# Patient Record
Sex: Female | Born: 1985 | Race: Black or African American | Hispanic: No | Marital: Single | Smoking: Never smoker
Health system: Southern US, Community
[De-identification: ages and names within clinical notes are randomized; demographics above are authoritative.]

## PROBLEM LIST (undated history)

## (undated) ENCOUNTER — Inpatient Hospital Stay (HOSPITAL_COMMUNITY): Payer: Self-pay

## (undated) DIAGNOSIS — F32A Depression, unspecified: Secondary | ICD-10-CM

## (undated) DIAGNOSIS — J45909 Unspecified asthma, uncomplicated: Secondary | ICD-10-CM

## (undated) DIAGNOSIS — F329 Major depressive disorder, single episode, unspecified: Secondary | ICD-10-CM

## (undated) HISTORY — DX: Unspecified asthma, uncomplicated: J45.909

## (undated) HISTORY — DX: Depression, unspecified: F32.A

---
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
## 2008-10-08 ENCOUNTER — Emergency Department (HOSPITAL_COMMUNITY): Admission: EM | Admit: 2008-10-08 | Discharge: 2008-10-08 | Payer: Self-pay | Admitting: Emergency Medicine

## 2010-12-05 LAB — URINALYSIS, ROUTINE W REFLEX MICROSCOPIC
Glucose, UA: NEGATIVE mg/dL
Ketones, ur: 15 mg/dL — AB
Nitrite: NEGATIVE
Protein, ur: NEGATIVE mg/dL

## 2010-12-05 LAB — COMPREHENSIVE METABOLIC PANEL
ALT: 15 U/L (ref 0–35)
Alkaline Phosphatase: 66 U/L (ref 39–117)
CO2: 22 mEq/L (ref 19–32)
Calcium: 9.1 mg/dL (ref 8.4–10.5)
Chloride: 108 mEq/L (ref 96–112)
GFR calc non Af Amer: >60 mL/min (ref >60)
Glucose, Bld: 97 mg/dL (ref 70–99)
Sodium: 137 mEq/L (ref 135–145)
Total Bilirubin: 0.4 mg/dL (ref 0.3–1.2)

## 2010-12-05 LAB — CBC
Hemoglobin: 12.4 g/dL (ref 12.0–15.0)
MCHC: 34.3 g/dL (ref 30.0–36.0)
RBC: 4.21 MIL/uL (ref 3.87–5.11)

## 2010-12-05 LAB — POCT I-STAT, CHEM 8
HCT: 36 % (ref 36.0–46.0)
Hemoglobin: 12.2 g/dL (ref 12.0–15.0)
Potassium: 3.4 mEq/L — ABNORMAL LOW (ref 3.5–5.1)
Sodium: 140 mEq/L (ref 135–145)
TCO2: 22 mmol/L (ref 0–100)

## 2010-12-05 LAB — URINE MICROSCOPIC-ADD ON

## 2010-12-05 LAB — DIFFERENTIAL
Basophils Absolute: 0 10*3/uL (ref 0.0–0.1)
Basophils Relative: 0 % (ref 0–1)
Eosinophils Absolute: 0.3 10*3/uL (ref 0.0–0.7)
Neutrophils Relative %: 79 % — ABNORMAL HIGH (ref 43–77)

## 2013-04-08 ENCOUNTER — Ambulatory Visit: Payer: BC Managed Care – PPO | Attending: Internal Medicine | Admitting: Internal Medicine

## 2013-04-08 VITALS — BP 111/75 | HR 76 | Temp 98.2°F | Resp 18 | Wt 157.8 lb

## 2013-04-08 DIAGNOSIS — Z0000 Encounter for general adult medical examination without abnormal findings: Secondary | ICD-10-CM

## 2013-04-08 MED ORDER — TUBERCULIN PPD 5 UNIT/0.1ML ID SOLN
5.0000 [IU] | Freq: Once | INTRADERMAL | Status: AC
  Administered 2013-04-10: 5 [IU] via INTRADERMAL

## 2013-04-08 NOTE — Progress Notes (Signed)
Patient Demographics  Colleen Boyd, is a 27 y.o. female  WUJ:[id number]  NFA:[id number]  DOB - Jul 07, 1986  Chief Complaint  Patient presents with  . Establish Care        Subjective:   Colleen Boyd today is here to establish primary care. Patient has a  Past Medical History  Diagnosis Date  . Asthma   . Currently patient has no complaints. 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 Patient has also has No headache, No chest pain, No abdominal pain,No Nausea, No new weakness tingling or numbness, No Cough or SOB.   Objective:    Filed Vitals:   04/08/13 1707  BP: 111/75  Pulse: 76  Temp: 98.2 F (36.8 C)  Resp: 18  Weight: 157 lb 12.8 oz (71.578 kg)  SpO2: 99%     ALLERGIES:  No Known Allergies  PAST MEDICAL HISTORY: Past Medical History  Diagnosis Date  . Asthma     PAST SURGICAL HISTORY: History reviewed. No pertinent past surgical history.  FAMILY HISTORY: History reviewed. No pertinent family history.  MEDICATIONS AT HOME: Prior to Admission medications   Not on File    SOCIAL HISTORY:   reports that she has never smoked. She does not have any smokeless tobacco history on file. Her alcohol and drug histories are not on file.  REVIEW OF SYSTEMS:  Constitutional:   No   Fevers, chills, fatigue.  HEENT:    No headaches, Sore throat,   Cardio-vascular: No chest pain,  Orthopnea, swelling in lower extremities, anasarca, palpitations  GI:  No abdominal pain, nausea, vomiting, diarrhea  Resp: No shortness of breath,  No coughing up of blood.No cough.No wheezing.  Skin:  no rash or lesions.  GU:  no dysuria, change in color of urine, no urgency or frequency.  No flank pain.  Musculoskeletal: No joint pain or swelling.  No decreased range of motion.  No back  pain.  Psych: No change in mood or affect. No depression or anxiety.  No memory loss.   Exam  General appearance :Awake, alert, not in any distress. Speech Clear. Not toxic Looking HEENT: Atraumatic and Normocephalic, pupils equally reactive to light and accomodation Neck: supple, no JVD. No cervical lymphadenopathy.  Chest:Good air entry bilaterally, no added sounds  CVS: S1 S2 regular, no murmurs.  Abdomen: Bowel sounds present, Non tender and not distended with no gaurding, rigidity or rebound. Extremities: B/L Lower Ext shows no edema, both legs are warm to touch Neurology: Awake alert, and oriented X 3, CN II-XII intact, Non focal Skin:No Rash Wounds:N/A    Data Review   CBC No results found for this basename: WBC, HGB, HCT, PLT, MCV, MCH, MCHC, RDW, NEUTRABS, LYMPHSABS, MONOABS, EOSABS, BASOSABS, BANDABS, BANDSABD,  in the last 168 hours  Chemistries   No results found for this basename: NA, K, CL, CO2, GLUCOSE, BUN, CREATININE, GFRCGP, CALCIUM, MG, AST, ALT, ALKPHOS, BILITOT,  in the last 168 hours ------------------------------------------------------------------------------------------------------------------ No results found for this basename: HGBA1C,  in the last 72 hours ------------------------------------------------------------------------------------------------------------------ No results found for this basename: CHOL, HDL, LDLCALC, TRIG, CHOLHDL, LDLDIRECT,  in the last 72 hours ------------------------------------------------------------------------------------------------------------------ No results found for this basename: TSH, T4TOTAL, FREET3, T3FREE, THYROIDAB,  in the last 72 hours ------------------------------------------------------------------------------------------------------------------ No results found for this basename: VITAMINB12, FOLATE, FERRITIN, TIBC, IRON, RETICCTPCT,  in  the last 72 hours  Coagulation profile  No results found for this  basename: INR, PROTIME,  in the last 168 hours    Assessment & Plan   Exam within normal limits-needs a PPD-if negative-we can go ahead and sign her paperwork. We'll place a PPD today, bring her back in 48 hours.

## 2013-04-08 NOTE — Progress Notes (Signed)
Patient here to establish care Works for [employer] Needs TB test

## 2013-04-10 ENCOUNTER — Ambulatory Visit: Payer: BC Managed Care – PPO | Attending: Family Medicine

## 2013-04-10 NOTE — Progress Notes (Unsigned)
Patient came in to have her PPD test read It was neg with 0 duration

## 2013-12-04 ENCOUNTER — Inpatient Hospital Stay (HOSPITAL_COMMUNITY)
Admission: AD | Admit: 2013-12-04 | Discharge: 2013-12-04 | Disposition: A | Discharge status: Home / Self Care | Payer: BC Managed Care – PPO | Source: Ambulatory Visit | Attending: Family Medicine | Admitting: Family Medicine

## 2013-12-04 ENCOUNTER — Emergency Department (HOSPITAL_COMMUNITY)
Admission: EM | Admit: 2013-12-04 | Discharge: 2013-12-04 | Discharge status: Absent without leave | Payer: BC Managed Care – PPO | Source: Home / Self Care

## 2013-12-04 ENCOUNTER — Encounter (HOSPITAL_COMMUNITY): Payer: Self-pay

## 2013-12-04 ENCOUNTER — Inpatient Hospital Stay (HOSPITAL_COMMUNITY): Payer: BC Managed Care – PPO

## 2013-12-04 DIAGNOSIS — O34599 Maternal care for other abnormalities of gravid uterus, unspecified trimester: Secondary | ICD-10-CM | POA: Insufficient documentation

## 2013-12-04 DIAGNOSIS — N831 Corpus luteum cyst of ovary, unspecified side: Secondary | ICD-10-CM | POA: Insufficient documentation

## 2013-12-04 DIAGNOSIS — O43899 Other placental disorders, unspecified trimester: Secondary | ICD-10-CM

## 2013-12-04 DIAGNOSIS — O208 Other hemorrhage in early pregnancy: Secondary | ICD-10-CM | POA: Insufficient documentation

## 2013-12-04 DIAGNOSIS — O468X1 Other antepartum hemorrhage, first trimester: Secondary | ICD-10-CM

## 2013-12-04 DIAGNOSIS — O418X10 Other specified disorders of amniotic fluid and membranes, first trimester, not applicable or unspecified: Secondary | ICD-10-CM

## 2013-12-04 LAB — CBC
HEMATOCRIT: 37 % (ref 36.0–46.0)
HEMOGLOBIN: 12.6 g/dL (ref 12.0–15.0)
MCH: 27.9 pg (ref 26.0–34.0)
MCHC: 34.1 g/dL (ref 30.0–36.0)
MCV: 81.9 fL (ref 78.0–100.0)
Platelets: 309 10*3/uL (ref 150–400)
RBC: 4.52 MIL/uL (ref 3.87–5.11)
RDW: 14.1 % (ref 11.5–15.5)
WBC: 13.4 10*3/uL — ABNORMAL HIGH (ref 4.0–10.5)

## 2013-12-04 LAB — POCT PREGNANCY, URINE: Preg Test, Ur: POSITIVE — AB

## 2013-12-04 LAB — URINALYSIS, ROUTINE W REFLEX MICROSCOPIC
BILIRUBIN URINE: NEGATIVE
GLUCOSE, UA: NEGATIVE mg/dL
Ketones, ur: 15 mg/dL — AB
NITRITE: NEGATIVE
PH: 5 (ref 5.0–8.0)
Protein, ur: NEGATIVE mg/dL
SPECIFIC GRAVITY, URINE: 1.025 (ref 1.005–1.030)
Urobilinogen, UA: 0.2 mg/dL (ref 0.0–1.0)

## 2013-12-04 LAB — URINE MICROSCOPIC-ADD ON

## 2013-12-04 LAB — WET PREP, GENITAL
CLUE CELLS WET PREP: NONE SEEN
Trich, Wet Prep: NONE SEEN
Yeast Wet Prep HPF POC: NONE SEEN

## 2013-12-04 LAB — ABO/RH: ABO/RH(D): O POS

## 2013-12-04 LAB — HCG, QUANTITATIVE, PREGNANCY: HCG, BETA CHAIN, QUANT, S: 9470 m[IU]/mL — AB (ref <5)

## 2013-12-04 NOTE — Discharge Instructions (Signed)
Pelvic Rest °Pelvic rest is sometimes recommended for women when:  °· The placenta is partially or completely covering the opening of the cervix (placenta previa). °· There is bleeding between the uterine wall and the amniotic sac in the first trimester (subchorionic hemorrhage). °· The cervix begins to open without labor starting (incompetent cervix, cervical insufficiency). °· The labor is too early (preterm labor). °HOME CARE INSTRUCTIONS °· Do not have sexual intercourse, stimulation, or an orgasm. °· Do not use tampons, douche, or put anything in the vagina. °· Do not lift anything over 10 pounds (4.5 kg). °· Avoid strenuous activity or straining your pelvic muscles. °SEEK MEDICAL CARE IF:  °· You have any vaginal bleeding during pregnancy. Treat this as a potential emergency. °· You have cramping pain felt low in the stomach (stronger than menstrual cramps). °· You notice vaginal discharge (watery, mucus, or bloody). °· You have a low, dull backache. °· There are regular contractions or uterine tightening. °SEEK IMMEDIATE MEDICAL CARE IF: °You have vaginal bleeding and have placenta previa.  °Document Released: 12/01/2010 Document Revised: 10/29/2011 Document Reviewed: 12/01/2010 °ExitCare® Patient Information ©2014 [employer]. °Subchorionic Hematoma °A subchorionic hematoma is a gathering of blood between the outer wall of the placenta and the inner wall of the womb (uterus). The placenta is the organ that connects the fetus to the wall of the uterus. The placenta performs the feeding, breathing (oxygen to the fetus), and waste removal (excretory work) of the fetus.  °Subchorionic hematoma is the most common abnormality found on a result from ultrasonography done during the first trimester or early second trimester of pregnancy. If there has been little or no vaginal bleeding, early small hematomas usually shrink on their own and do not affect your baby or pregnancy. The blood is gradually absorbed over 1  2 weeks. When bleeding starts later in pregnancy or the hematoma is larger or occurs in an older pregnant woman, the outcome may not be as good. Larger hematomas may get bigger, which increases the chances for miscarriage. Subchorionic hematoma also increases the risk of premature detachment of the placenta from the uterus, preterm (premature) labor, and stillbirth. °HOME CARE INSTRUCTIONS  °· Stay on bed rest if your health care provider recommends this. Although bed rest will not prevent more bleeding or prevent a miscarriage, your health care provider may recommend bed rest until you are advised otherwise. °· Avoid heavy lifting (more than 10 lb [4.5 kg]), exercise, sexual intercourse, or douching as directed by your health care provider. °· Keep track of the number of pads you use each day and how soaked (saturated) they are. Write down this information. °· Do not use tampons. °· Keep all follow-up appointments as directed by your health care provider. Your health care provider may ask you to have follow-up blood tests or ultrasound tests or both. °SEEK IMMEDIATE MEDICAL CARE IF:  °· You have severe cramps in your stomach, back, abdomen, or pelvis. °· You have a fever. °· You pass large clots or tissue. Save any tissue for your health care provider to look at. °· Your bleeding increases or you become lightheaded, feel weak, or have fainting episodes. °Document Released: 11/21/2006 Document Revised: 05/27/2013 Document Reviewed: 03/05/2013 °ExitCare® Patient Information ©2014 [employer]. ° °

## 2013-12-04 NOTE — MAU Provider Note (Signed)
History     CSN: [id number]  Arrival date and time: 12/04/13 1628   None     Chief Complaint  Patient presents with  . Possible Pregnancy  . Vaginal Bleeding   HPI Colleen Boyd is 28 y.o. G1P0 [redacted]w[redacted]d weeks presenting with vaginal bleeding in early pregnancy.  A little heavier now.  She had + pregnancy test at [facility] in [place], had bleeding and states she no longer feels pregnant.    Past Medical History  Diagnosis Date  . Asthma     History reviewed. No pertinent past surgical history.  Family History  Problem Relation Age of Onset  . Hypertension Maternal Grandmother     History  Substance Use Topics  . Smoking status: Never Smoker   . Smokeless tobacco: Not on file  . Alcohol Use: Yes     Comment: ocassionally    Allergies: No Known Allergies  Prescriptions prior to admission  Medication Sig Dispense Refill  . Prenatal Vit-Fe Fumarate-FA (PRENATAL MULTIVITAMIN) TABS tablet Take 1 tablet by mouth daily at 12 noon.        Review of Systems  Constitutional: Negative for fever and chills.  Gastrointestinal: Negative for nausea, vomiting and abdominal pain.  Genitourinary: Negative for dysuria, urgency and frequency.       + for vaginal spotting.  Neurological: Negative for headaches.   Physical Exam   Blood pressure 118/71, pulse 90, temperature 98.7 F (37.1 C), temperature source Oral, resp. rate 16, height 5\' 5"  (1.651 m), weight 75.479 kg (166 lb 6.4 oz), last menstrual period 10/09/2013, SpO2 100.00%.  Physical Exam  Constitutional: She is oriented to person, place, and time. She appears well-developed and well-nourished. No distress.  HENT:  Head: Normocephalic.  Cardiovascular: Normal rate.   Respiratory: Effort normal.  Genitourinary: Uterus is not enlarged and not tender. Cervix exhibits no motion tenderness, no discharge and no friability. Right adnexum displays no mass and no tenderness. Left adnexum displays no mass and no tenderness. There is  bleeding (scant dark brown blood noted in the vaginal vault) around the vagina. No vaginal discharge found.  Neurological: She is alert and oriented to person, place, and time.  Skin: Skin is warm and dry. No erythema.  Psychiatric: She has a normal mood and affect.   Results for orders placed during the hospital encounter of 12/04/13 (from the past 24 hour(s))  URINALYSIS, ROUTINE W REFLEX MICROSCOPIC     Status: Abnormal   Collection Time    12/04/13  4:55 PM      Result Value Ref Range   Color, Urine YELLOW  YELLOW   APPearance CLEAR  CLEAR   Specific Gravity, Urine 1.025  1.005 - 1.030   pH 5.0  5.0 - 8.0   Glucose, UA NEGATIVE  NEGATIVE mg/dL   Hgb urine dipstick LARGE (*) NEGATIVE   Bilirubin Urine NEGATIVE  NEGATIVE   Ketones, ur 15 (*) NEGATIVE mg/dL   Protein, ur NEGATIVE  NEGATIVE mg/dL   Urobilinogen, UA 0.2  0.0 - 1.0 mg/dL   Nitrite NEGATIVE  NEGATIVE   Leukocytes, UA TRACE (*) NEGATIVE  URINE MICROSCOPIC-ADD ON     Status: Abnormal   Collection Time    12/04/13  4:55 PM      Result Value Ref Range   Squamous Epithelial / LPF FEW (*) RARE   WBC, UA 3-6  <3 WBC/hpf   RBC / HPF 3-6  <3 RBC/hpf   Bacteria, UA MANY (*) RARE  HCG, QUANTITATIVE, PREGNANCY  Status: Abnormal   Collection Time    12/04/13  5:10 PM      Result Value Ref Range   hCG, Beta Chain, Quant, S 9470 (*) <5 mIU/mL  ABO/RH     Status: None   Collection Time    12/04/13  5:10 PM      Result Value Ref Range   ABO/RH(D) O POS    CBC     Status: Abnormal   Collection Time    12/04/13  5:11 PM      Result Value Ref Range   WBC 13.4 (*) 4.0 - 10.5 K/uL   RBC 4.52  3.87 - 5.11 MIL/uL   Hemoglobin 12.6  12.0 - 15.0 g/dL   HCT 16.137.0  09.636.0 - 04.546.0 %   MCV 81.9  78.0 - 100.0 fL   MCH 27.9  26.0 - 34.0 pg   MCHC 34.1  30.0 - 36.0 g/dL   RDW 40.914.1  81.111.5 - 91.415.5 %   Platelets 309  150 - 400 K/uL  POCT PREGNANCY, URINE     Status: Abnormal   Collection Time    12/04/13  5:12 PM      Result Value Ref  Range   Preg Test, Ur POSITIVE (*) NEGATIVE  WET PREP, GENITAL     Status: Abnormal   Collection Time    12/04/13  7:15 PM      Result Value Ref Range   Yeast Wet Prep HPF POC NONE SEEN  NONE SEEN   Trich, Wet Prep NONE SEEN  NONE SEEN   Clue Cells Wet Prep HPF POC NONE SEEN  NONE SEEN   WBC, Wet Prep HPF POC FEW (*) NONE SEEN   MAU Course  Procedures  MDM Care turned over to J. Vernie AmmonsEthier, PA-C at 18:00 Verita Schneidersvelyn M Key 12/04/2013, 5:52 PM  1800 - Care assumed from Jeani SowEve Key, NP  Assessment and Plan    [place] Ob Comp Less 14 Wks  12/04/2013   CLINICAL DATA:  Bleeding in early pregnancy.  EXAM: OBSTETRIC <14 WK US AND TRANSVAGINAL OB US  TECHNIQUE: Both transabdominal and transvaginal ultrasound examinations were performed for complete evaluation of the gestation as well as the maternal uterus, adnexal regions, and pelvic cul-de-sac. Transvaginal technique was performed to assess early pregnancy.  COMPARISON:  None.  FINDINGS: Intrauterine gestational sac: Visualized/normal in shape.  Yolk sac:  Visualized.  Embryo:  Visualized.  Cardiac Activity: Visualized.  Heart Rate:  139 bpm  CRL:   3.2  mm   6 w 6 d                  US EDC: 07/24/2014  Maternal uterus/adnexae: Small subchorionic hemorrhage. Corpus luteum cyst in the right ovary. Left ovary is visualized. No free fluid.  IMPRESSION: 1. Single living intrauterine pregnancy with gestational age of [redacted] weeks 6 days and estimated date of confinement of 07/24/2014. 2. Small subchorionic hemorrhage.   Electronically Signed   By: Leanna BattlesMelinda  Blietz M.D.   On: 12/04/2013 18:58   [place] Ob Transvaginal  12/04/2013   CLINICAL DATA:  Bleeding in early pregnancy.  EXAM: OBSTETRIC <14 WK US AND TRANSVAGINAL OB US  TECHNIQUE: Both transabdominal and transvaginal ultrasound examinations were performed for complete evaluation of the gestation as well as the maternal uterus, adnexal regions, and pelvic cul-de-sac. Transvaginal technique was performed to assess early  pregnancy.  COMPARISON:  None.  FINDINGS: Intrauterine gestational sac: Visualized/normal in shape.  Yolk sac:  Visualized.  Embryo:  Visualized.  Cardiac Activity: Visualized.  Heart Rate:  139 bpm  CRL:   3.2  mm   6 w 6 d                  US EDC: 07/24/2014  Maternal uterus/adnexae: Small subchorionic hemorrhage. Corpus luteum cyst in the right ovary. Left ovary is visualized. No free fluid.  IMPRESSION: 1. Single living intrauterine pregnancy with gestational age of [redacted] weeks 6 days and estimated date of confinement of 07/24/2014. 2. Small subchorionic hemorrhage.   Electronically Signed   By: Leanna BattlesMelinda  Blietz M.D.   On: 12/04/2013 18:58    A: SIUP at 1190w6d with normal cardiac activity Small subchorionic hemorrhage  P: Discharge home Bleeding precautions discussed Pelvic rest advised Patient plans to start care with [facility] Pregnancy confirmation letter given Patient may return to MAU as needed or if her condition were to change or worsen  Freddi StarrJulie N Ethier, PA-C 12/04/2013 7:35 PM

## 2013-12-04 NOTE — MAU Note (Signed)
Patient states she has had a positive pregnancy at a [facility] office in [place]. States she had a little dark pink spotting on tissue with wiping and does not feel pregnant any more. Denies pain. Denies nausea or vomiting.

## 2013-12-04 NOTE — MAU Provider Note (Signed)
Attestation of Attending Supervision of Advanced Practitioner (PA/CNM/NP): Evaluation and management procedures were performed by the Advanced Practitioner under my supervision and collaboration.  I have reviewed the Advanced Practitioner's note and chart, and I agree with the management and plan.  Daine Gunther S Cheron Pasquarelli, MD [facility] Attending 12/04/2013 9:56 PM   

## 2013-12-05 LAB — GC/CHLAMYDIA PROBE AMP
CT Probe RNA: NEGATIVE
GC Probe RNA: NEGATIVE

## 2014-06-21 ENCOUNTER — Encounter (HOSPITAL_COMMUNITY): Payer: Self-pay

## 2014-10-09 ENCOUNTER — Encounter (HOSPITAL_COMMUNITY): Payer: Self-pay | Admitting: *Deleted

## 2015-02-08 IMAGING — US US OB COMP LESS 14 WK
1 series · 14 of 28 positions shown · non-contrast
Comparison: None.

CLINICAL DATA: Bleeding in early pregnancy.

EXAM:
OBSTETRIC <14 WK US AND TRANSVAGINAL OB US
TECHNIQUE: Both transabdominal and transvaginal ultrasound examinations were
performed for complete evaluation of the gestation as well as the
maternal uterus, adnexal regions, and pelvic cul-de-sac.
Transvaginal technique was performed to assess early pregnancy.

[Series 1: us ob comp less 14 wks · 14 of 64 slices shown]
[im 3/64]
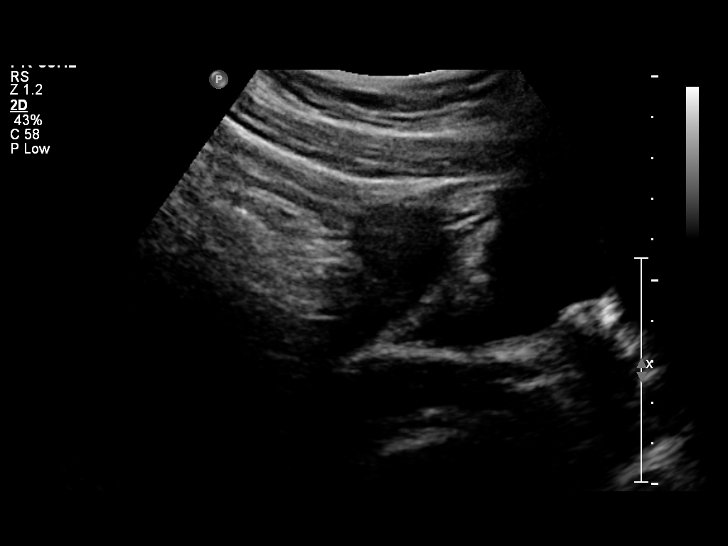
[im 8/64]
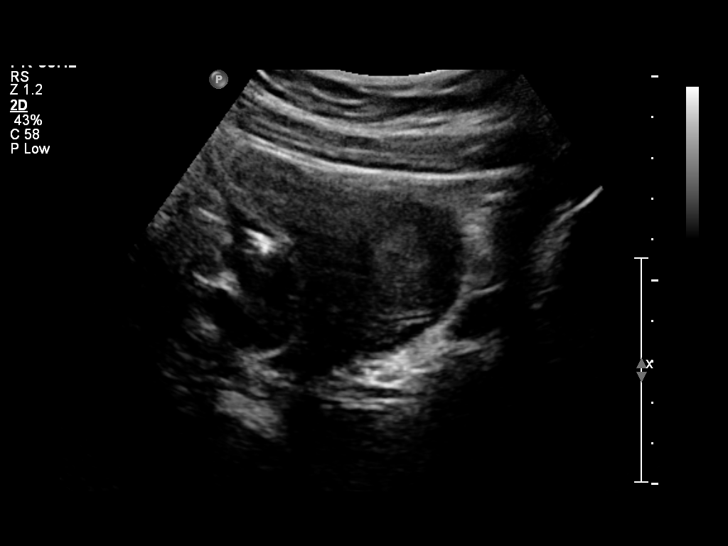
[im 12/64]
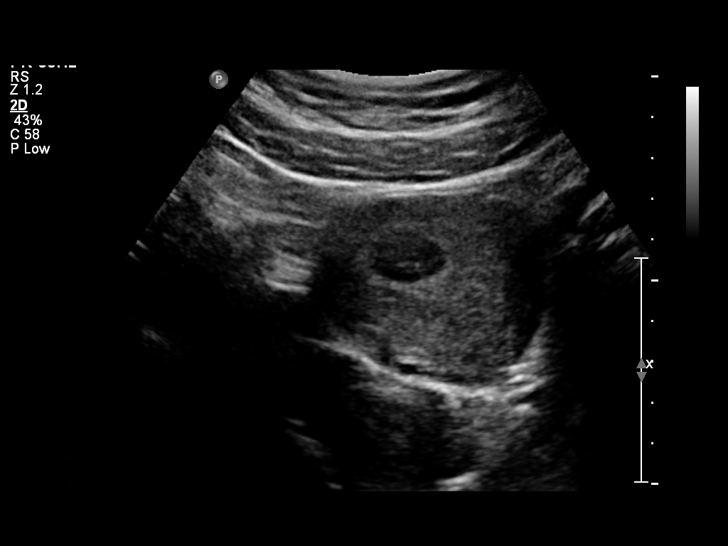
[im 17/64]
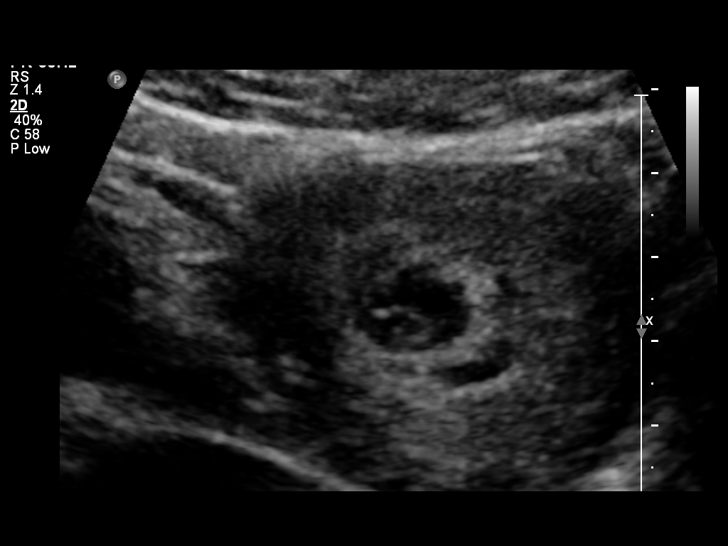
[im 22/64]
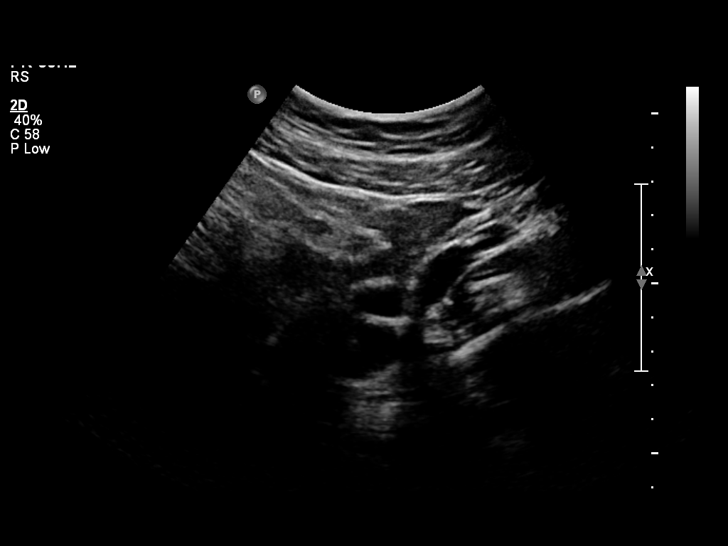
[im 26/64]
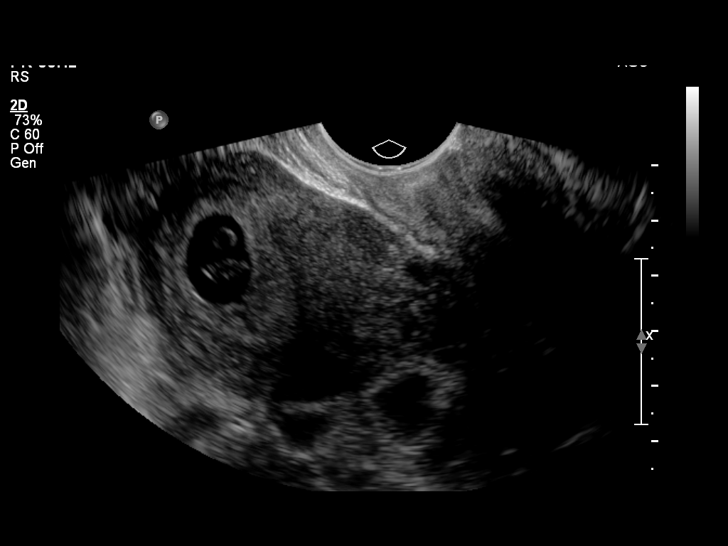
[im 31/64]
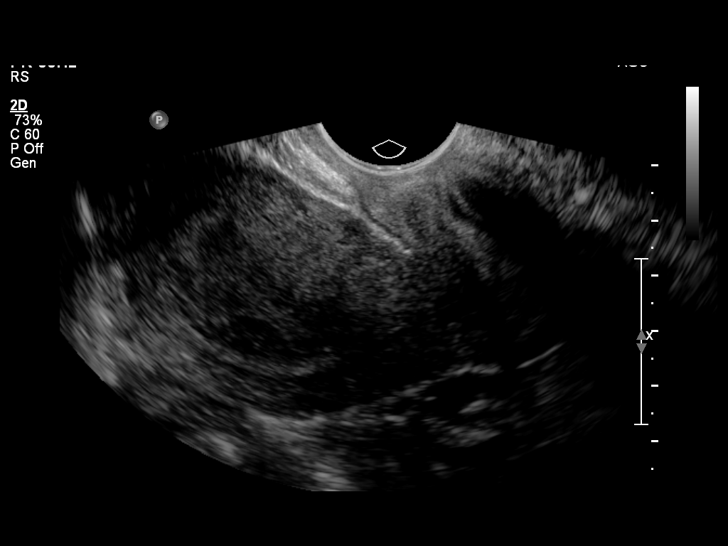
[im 36/64]
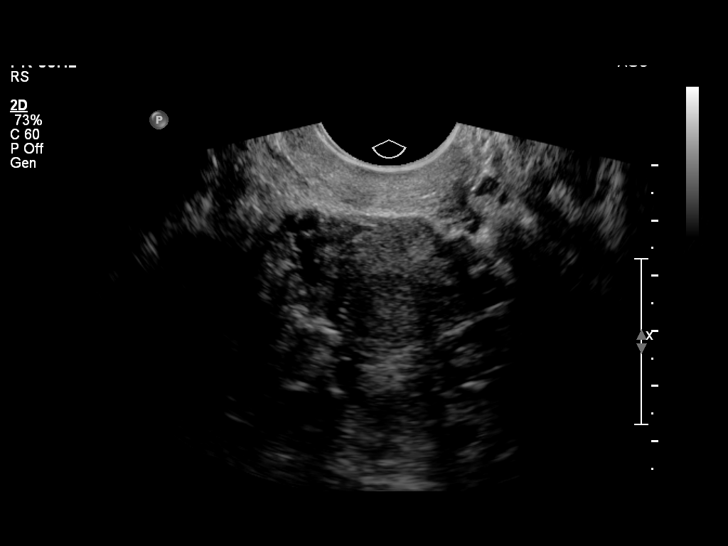
[im 40/64]
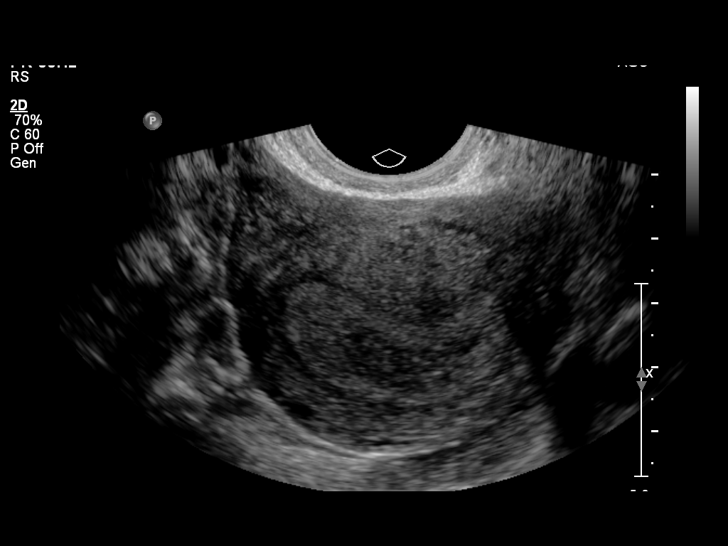
[im 45/64]
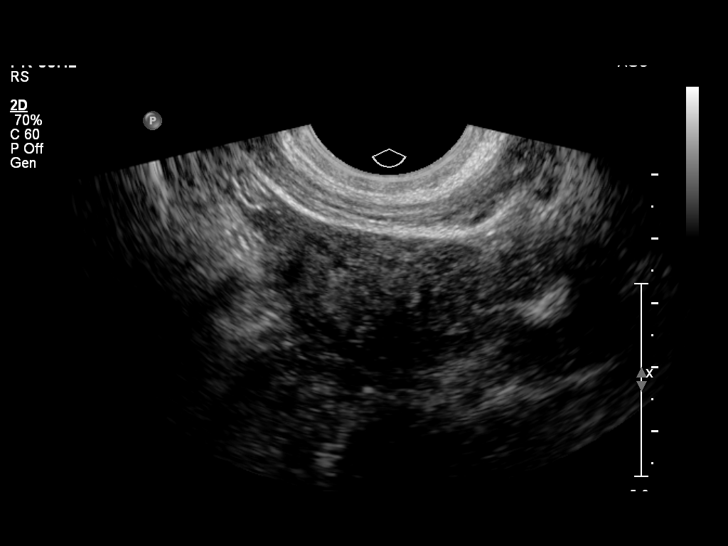
[im 50/64]
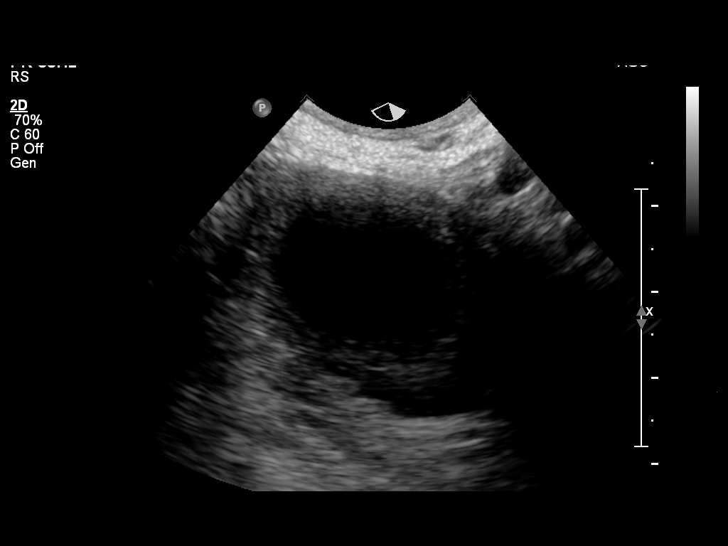
[im 54/64]
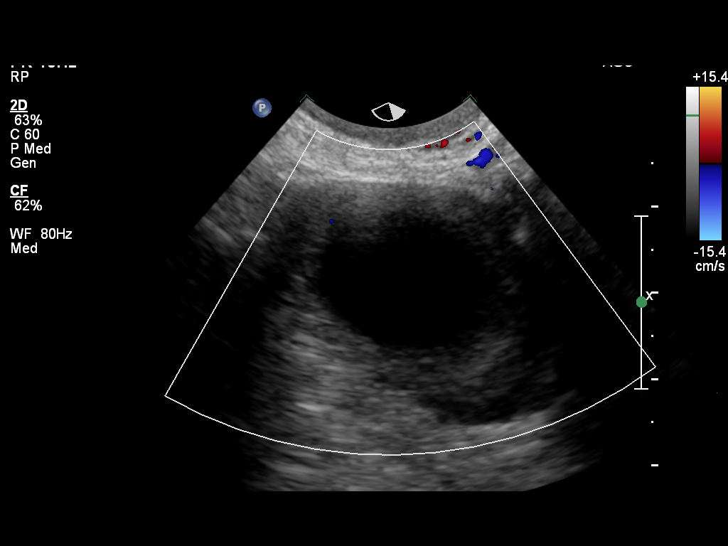
[im 59/64]
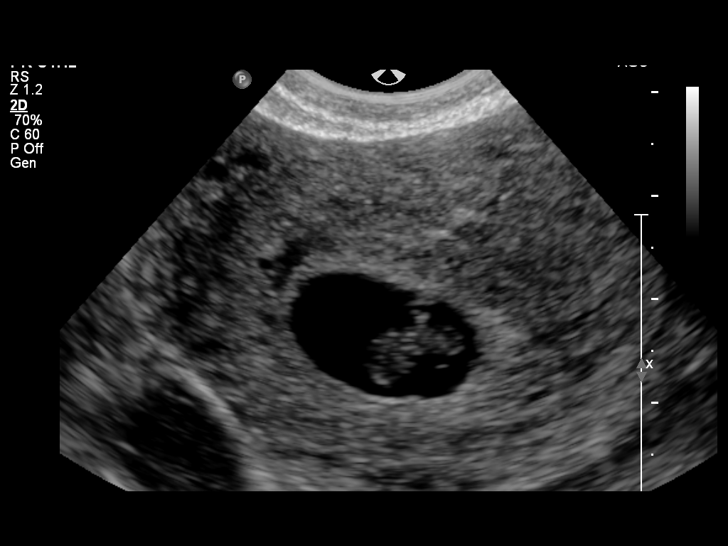
[im 64/64]
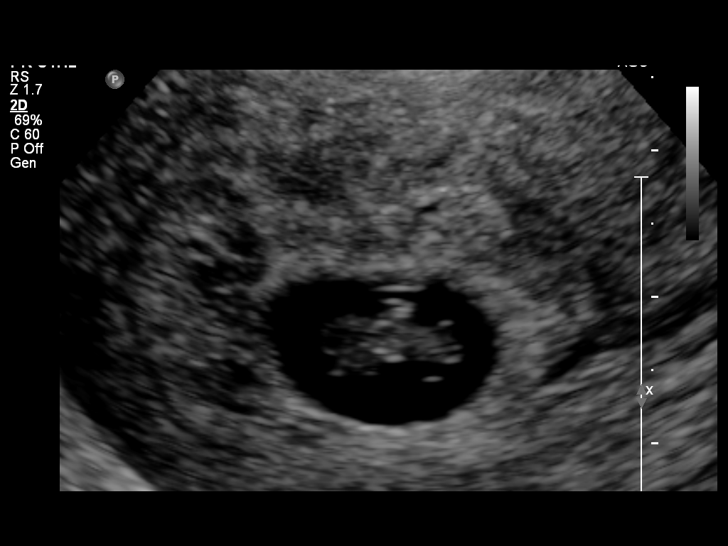

[14 of 28 positions shown; findings below may reference images not displayed]

FINDINGS: Intrauterine gestational sac: Visualized/normal in shape.

Yolk sac:  Visualized.

Embryo:  Visualized.

Cardiac Activity: Visualized.

Heart Rate:  139 bpm

CRL:   3.2  mm   6 w 6 d                  US EDC: 07/24/2014

Maternal uterus/adnexae: Small subchorionic hemorrhage. Corpus
luteum cyst in the right ovary. Left ovary is visualized. No free
fluid.
IMPRESSION: 1. Single living intrauterine pregnancy with gestational age of 6
weeks 6 days and estimated date of confinement of 07/24/2014.
2. Small subchorionic hemorrhage.

## 2015-09-15 LAB — OB RESULTS CONSOLE RUBELLA ANTIBODY, IGM: RUBELLA: IMMUNE

## 2015-09-15 LAB — OB RESULTS CONSOLE ABO/RH: RH TYPE: POSITIVE

## 2015-09-15 LAB — OB RESULTS CONSOLE HEPATITIS B SURFACE ANTIGEN: Hepatitis B Surface Ag: NEGATIVE

## 2015-09-15 LAB — OB RESULTS CONSOLE GC/CHLAMYDIA
Chlamydia: NEGATIVE
Gonorrhea: NEGATIVE

## 2015-09-15 LAB — OB RESULTS CONSOLE RPR: RPR: NONREACTIVE

## 2015-09-15 LAB — OB RESULTS CONSOLE ANTIBODY SCREEN: Antibody Screen: NEGATIVE

## 2015-09-15 LAB — OB RESULTS CONSOLE HIV ANTIBODY (ROUTINE TESTING): HIV: NONREACTIVE

## 2016-03-09 ENCOUNTER — Inpatient Hospital Stay (HOSPITAL_COMMUNITY): Payer: BC Managed Care – PPO | Admitting: Anesthesiology

## 2016-03-09 ENCOUNTER — Encounter (HOSPITAL_COMMUNITY): Payer: Self-pay | Admitting: *Deleted

## 2016-03-09 ENCOUNTER — Encounter (HOSPITAL_COMMUNITY)
Admission: AD | Disposition: A | Discharge status: Home / Self Care | Payer: Self-pay | Source: Ambulatory Visit | Attending: Obstetrics and Gynecology

## 2016-03-09 ENCOUNTER — Inpatient Hospital Stay (HOSPITAL_COMMUNITY)
Admission: AD | Admit: 2016-03-09 | Discharge: 2016-03-12 | DRG: 766 | Disposition: A | Discharge status: Home / Self Care | Payer: BC Managed Care – PPO | Source: Ambulatory Visit | Attending: Obstetrics and Gynecology | Admitting: Obstetrics and Gynecology

## 2016-03-09 DIAGNOSIS — Z3A38 38 weeks gestation of pregnancy: Secondary | ICD-10-CM | POA: Diagnosis not present

## 2016-03-09 DIAGNOSIS — Z8249 Family history of ischemic heart disease and other diseases of the circulatory system: Secondary | ICD-10-CM

## 2016-03-09 DIAGNOSIS — J45909 Unspecified asthma, uncomplicated: Secondary | ICD-10-CM | POA: Diagnosis present

## 2016-03-09 DIAGNOSIS — O4202 Full-term premature rupture of membranes, onset of labor within 24 hours of rupture: Secondary | ICD-10-CM | POA: Diagnosis present

## 2016-03-09 DIAGNOSIS — O9952 Diseases of the respiratory system complicating childbirth: Secondary | ICD-10-CM | POA: Diagnosis present

## 2016-03-09 DIAGNOSIS — IMO0001 Reserved for inherently not codable concepts without codable children: Secondary | ICD-10-CM

## 2016-03-09 LAB — CBC
HCT: 31.5 % — ABNORMAL LOW (ref 36.0–46.0)
Hemoglobin: 10.5 g/dL — ABNORMAL LOW (ref 12.0–15.0)
MCH: 24.5 pg — AB (ref 26.0–34.0)
MCHC: 33.3 g/dL (ref 30.0–36.0)
MCV: 73.6 fL — ABNORMAL LOW (ref 78.0–100.0)
PLATELETS: 299 10*3/uL (ref 150–400)
RBC: 4.28 MIL/uL (ref 3.87–5.11)
RDW: 15.9 % — ABNORMAL HIGH (ref 11.5–15.5)
WBC: 19.4 10*3/uL — ABNORMAL HIGH (ref 4.0–10.5)

## 2016-03-09 LAB — TYPE AND SCREEN
ABO/RH(D): O POS
Antibody Screen: NEGATIVE

## 2016-03-09 LAB — OB RESULTS CONSOLE GBS: STREP GROUP B AG: NEGATIVE

## 2016-03-09 SURGERY — Surgical Case
Anesthesia: Epidural

## 2016-03-09 MED ORDER — PHENYLEPHRINE 40 MCG/ML (10ML) SYRINGE FOR IV PUSH (FOR BLOOD PRESSURE SUPPORT): 80.0000 ug | PREFILLED_SYRINGE | INTRAVENOUS | Status: DC | PRN

## 2016-03-09 MED ORDER — OXYCODONE-ACETAMINOPHEN 5-325 MG PO TABS: 2.0000 | ORAL_TABLET | ORAL | Status: DC | PRN

## 2016-03-09 MED ORDER — OXYTOCIN 40 UNITS IN LACTATED RINGERS INFUSION - SIMPLE MED
1.0000 m[IU]/min | INTRAVENOUS | Status: DC
Start: 2016-03-09 — End: 2016-03-10
  Administered 2016-03-09: 2 m[IU]/min via INTRAVENOUS

## 2016-03-09 MED ORDER — FENTANYL 2.5 MCG/ML BUPIVACAINE 1/10 % EPIDURAL INFUSION (WH - ANES)
14.0000 mL/h | INTRAMUSCULAR | Status: DC | PRN
  Administered 2016-03-09 (×2): 14 mL/h via EPIDURAL
  Filled 2016-03-09 (×2): qty 125

## 2016-03-09 MED ORDER — DEXAMETHASONE SODIUM PHOSPHATE 4 MG/ML IJ SOLN
INTRAMUSCULAR | Status: DC | PRN
  Administered 2016-03-09: 4 mg via INTRAVENOUS

## 2016-03-09 MED ORDER — SODIUM BICARBONATE 8.4 % IV SOLN
INTRAVENOUS | Status: DC | PRN
  Administered 2016-03-09: 5 mL via EPIDURAL
  Administered 2016-03-09: 3 mL via EPIDURAL
  Administered 2016-03-09 (×2): 5 mL via EPIDURAL

## 2016-03-09 MED ORDER — OXYTOCIN 10 UNIT/ML IJ SOLN
INTRAMUSCULAR | Status: AC
  Filled 2016-03-09: qty 4

## 2016-03-09 MED ORDER — SODIUM CHLORIDE 0.9 % IR SOLN
Status: DC | PRN
  Administered 2016-03-09: 1

## 2016-03-09 MED ORDER — FLEET ENEMA 7-19 GM/118ML RE ENEM: 1.0000 | ENEMA | RECTAL | Status: DC | PRN

## 2016-03-09 MED ORDER — LACTATED RINGERS IV SOLN
INTRAVENOUS | Status: DC | PRN
  Administered 2016-03-09: 23:00:00 via INTRAVENOUS

## 2016-03-09 MED ORDER — LACTATED RINGERS IV SOLN
INTRAVENOUS | Status: DC
  Administered 2016-03-09 (×3): via INTRAVENOUS

## 2016-03-09 MED ORDER — OXYTOCIN 40 UNITS IN LACTATED RINGERS INFUSION - SIMPLE MED
2.5000 [IU]/h | INTRAVENOUS | Status: DC
  Filled 2016-03-09: qty 1000

## 2016-03-09 MED ORDER — SOD CITRATE-CITRIC ACID 500-334 MG/5ML PO SOLN
30.0000 mL | ORAL | Status: DC | PRN
  Administered 2016-03-09: 30 mL via ORAL
  Filled 2016-03-09: qty 15

## 2016-03-09 MED ORDER — PHENYLEPHRINE 40 MCG/ML (10ML) SYRINGE FOR IV PUSH (FOR BLOOD PRESSURE SUPPORT)
80.0000 ug | PREFILLED_SYRINGE | INTRAVENOUS | Status: DC | PRN
  Filled 2016-03-09 (×2): qty 10

## 2016-03-09 MED ORDER — ONDANSETRON HCL 4 MG/2ML IJ SOLN
INTRAMUSCULAR | Status: AC
  Filled 2016-03-09: qty 2

## 2016-03-09 MED ORDER — OXYTOCIN BOLUS FROM INFUSION: 500.0000 mL | INTRAVENOUS | Status: DC

## 2016-03-09 MED ORDER — ONDANSETRON HCL 4 MG/2ML IJ SOLN
INTRAMUSCULAR | Status: DC | PRN
  Administered 2016-03-09: 4 mg via INTRAVENOUS

## 2016-03-09 MED ORDER — LACTATED RINGERS IV SOLN: 500.0000 mL | Freq: Once | INTRAVENOUS | Status: DC

## 2016-03-09 MED ORDER — PHENYLEPHRINE 40 MCG/ML (10ML) SYRINGE FOR IV PUSH (FOR BLOOD PRESSURE SUPPORT)
80.0000 ug | PREFILLED_SYRINGE | INTRAVENOUS | Status: DC | PRN
  Administered 2016-03-09: 80 ug via INTRAVENOUS

## 2016-03-09 MED ORDER — EPHEDRINE 5 MG/ML INJ: 10.0000 mg | INTRAVENOUS | Status: DC | PRN

## 2016-03-09 MED ORDER — DEXTROSE 5 % IV SOLN
2.0000 g | Freq: Two times a day (BID) | INTRAVENOUS | Status: DC
  Administered 2016-03-09: 2 g via INTRAVENOUS
  Filled 2016-03-09 (×2): qty 2

## 2016-03-09 MED ORDER — SCOPOLAMINE 1 MG/3DAYS TD PT72
MEDICATED_PATCH | TRANSDERMAL | Status: DC | PRN
  Administered 2016-03-09: 1 via TRANSDERMAL

## 2016-03-09 MED ORDER — OXYTOCIN 10 UNIT/ML IJ SOLN
40.0000 [IU] | INTRAVENOUS | Status: DC | PRN
  Administered 2016-03-09: 40 [IU] via INTRAVENOUS

## 2016-03-09 MED ORDER — LACTATED RINGERS IV SOLN
500.0000 mL | Freq: Once | INTRAVENOUS | Status: AC
  Administered 2016-03-09: 500 mL via INTRAVENOUS

## 2016-03-09 MED ORDER — ONDANSETRON HCL 4 MG/2ML IJ SOLN: 4.0000 mg | Freq: Four times a day (QID) | INTRAMUSCULAR | Status: DC | PRN

## 2016-03-09 MED ORDER — LIDOCAINE HCL (PF) 1 % IJ SOLN
INTRAMUSCULAR | Status: DC | PRN
  Administered 2016-03-09: 4 mL via EPIDURAL
  Administered 2016-03-09: 4 mL

## 2016-03-09 MED ORDER — DIPHENHYDRAMINE HCL 50 MG/ML IJ SOLN: 12.5000 mg | INTRAMUSCULAR | Status: DC | PRN

## 2016-03-09 MED ORDER — LACTATED RINGERS IV SOLN: 500.0000 mL | INTRAVENOUS | Status: DC | PRN

## 2016-03-09 MED ORDER — OXYCODONE-ACETAMINOPHEN 5-325 MG PO TABS: 1.0000 | ORAL_TABLET | ORAL | Status: DC | PRN

## 2016-03-09 MED ORDER — LIDOCAINE HCL (PF) 1 % IJ SOLN: 30.0000 mL | INTRAMUSCULAR | Status: DC | PRN

## 2016-03-09 MED ORDER — MEPERIDINE HCL 25 MG/ML IJ SOLN
6.2500 mg | INTRAMUSCULAR | Status: DC | PRN
  Administered 2016-03-10: 6.25 mg via INTRAVENOUS

## 2016-03-09 MED ORDER — DEXAMETHASONE SODIUM PHOSPHATE 4 MG/ML IJ SOLN
INTRAMUSCULAR | Status: AC
  Filled 2016-03-09: qty 1

## 2016-03-09 MED ORDER — MEPERIDINE HCL 25 MG/ML IJ SOLN
INTRAMUSCULAR | Status: AC
  Filled 2016-03-09: qty 1

## 2016-03-09 MED ORDER — SCOPOLAMINE 1 MG/3DAYS TD PT72
MEDICATED_PATCH | TRANSDERMAL | Status: AC
  Filled 2016-03-09: qty 1

## 2016-03-09 MED ORDER — TERBUTALINE SULFATE 1 MG/ML IJ SOLN
0.2500 mg | Freq: Once | INTRAMUSCULAR | Status: AC | PRN
  Administered 2016-03-09: 0.25 mg via SUBCUTANEOUS
  Filled 2016-03-09: qty 1

## 2016-03-09 MED ORDER — MORPHINE SULFATE (PF) 0.5 MG/ML IJ SOLN
INTRAMUSCULAR | Status: DC | PRN
  Administered 2016-03-09: 3 mg via EPIDURAL

## 2016-03-09 MED ORDER — MORPHINE SULFATE (PF) 0.5 MG/ML IJ SOLN
INTRAMUSCULAR | Status: AC
  Filled 2016-03-09: qty 10

## 2016-03-09 MED ORDER — ACETAMINOPHEN 325 MG PO TABS: 650.0000 mg | ORAL_TABLET | ORAL | Status: DC | PRN

## 2016-03-09 SURGICAL SUPPLY — 31 items
CHLORAPREP W/TINT 26ML (MISCELLANEOUS) ×3 IMPLANT
CLAMP CORD UMBIL (MISCELLANEOUS) IMPLANT
CLOSURE WOUND 1/2 X4 (GAUZE/BANDAGES/DRESSINGS) ×1
CLOTH BEACON ORANGE TIMEOUT ST (SAFETY) ×3 IMPLANT
DRSG OPSITE POSTOP 4X10 (GAUZE/BANDAGES/DRESSINGS) ×3 IMPLANT
ELECT REM PT RETURN 9FT ADLT (ELECTROSURGICAL) ×3
ELECTRODE REM PT RTRN 9FT ADLT (ELECTROSURGICAL) ×1 IMPLANT
EXTRACTOR VACUUM M CUP 4 TUBE (SUCTIONS) IMPLANT
EXTRACTOR VACUUM M CUP 4' TUBE (SUCTIONS)
GLOVE BIOGEL PI IND STRL 7.0 (GLOVE) ×1 IMPLANT
GLOVE BIOGEL PI INDICATOR 7.0 (GLOVE) ×2
GLOVE SURG ORTHO 8.0 STRL STRW (GLOVE) ×3 IMPLANT
GOWN STRL REUS W/TWL LRG LVL3 (GOWN DISPOSABLE) ×6 IMPLANT
KIT ABG SYR 3ML LUER SLIP (SYRINGE) ×3 IMPLANT
NEEDLE HYPO 25X5/8 SAFETYGLIDE (NEEDLE) ×3 IMPLANT
NS IRRIG 1000ML POUR BTL (IV SOLUTION) ×3 IMPLANT
PACK C SECTION WH (CUSTOM PROCEDURE TRAY) ×3 IMPLANT
PAD OB MATERNITY 4.3X12.25 (PERSONAL CARE ITEMS) ×3 IMPLANT
PENCIL SMOKE EVAC W/HOLSTER (ELECTROSURGICAL) ×3 IMPLANT
SPONGE LAP 18X18 X RAY DECT (DISPOSABLE) ×6 IMPLANT
STRIP CLOSURE SKIN 1/2X4 (GAUZE/BANDAGES/DRESSINGS) ×2 IMPLANT
SUT MNCRL 0 VIOLET CTX 36 (SUTURE) ×4 IMPLANT
SUT MON AB 4-0 PS1 27 (SUTURE) ×6 IMPLANT
SUT MONOCRYL 0 CTX 36 (SUTURE) ×8
SUT PDS AB 1 CT  36 (SUTURE)
SUT PDS AB 1 CT 36 (SUTURE) IMPLANT
SUT VIC AB 1 CT1 36 (SUTURE) ×3 IMPLANT
SUT VIC AB 1 CTX 36 (SUTURE)
SUT VIC AB 1 CTX36XBRD ANBCTRL (SUTURE) IMPLANT
TOWEL OR 17X24 6PK STRL BLUE (TOWEL DISPOSABLE) ×3 IMPLANT
TRAY FOLEY CATH SILVER 14FR (SET/KITS/TRAYS/PACK) ×3 IMPLANT

## 2016-03-09 NOTE — Transfer of Care (Signed)
Immediate Anesthesia Transfer of Care Note  Patient: Colleen Boyd  Procedure(s) Performed: Procedure(s): CESAREAN SECTION (N/A)  Patient Location: PACU  Anesthesia Type:Epidural  Level of Consciousness: awake, alert  and oriented  Airway & Oxygen Therapy: Patient Spontanous Breathing  Post-op Assessment: Report given to RN and Post -op Vital signs reviewed and stable  Post vital signs: Reviewed and stable  Last Vitals:  Filed Vitals:   03/09/16 2130 03/09/16 2200  BP: 128/77 130/74  Pulse: 121 106  Temp:    Resp: 20 20    Last Pain:  Filed Vitals:   03/09/16 2202  PainSc: 0-No pain         Complications: No apparent anesthesia complications

## 2016-03-09 NOTE — Anesthesia Pain Management Evaluation Note (Signed)
  [facility] Pain Management Visit Note  Patient: Damaris SchoonerJulie Lard, 30 y.o., female  "Hello I am a [facility] of the anesthesia team at [facility]. We have an anesthesia team available at all times to provide care throughout the hospital, including epidural management and anesthesia for C-section. I don't know your plan for the delivery whether it a natural birth, water birth, IV sedation, nitrous supplementation, doula or epidural, but we want to meet your pain goals."   1.Was your pain managed to your expectations on prior hospitalizations?   No prior hospitalizations  2.What is your expectation for pain management during this hospitalization?     Epidural  3.How can we help you reach that goal? epidural  Record the patient's initial score and the patient's pain goal.   Pain: 0  Pain Goal: 7 [facility] wants you to be able to say your pain was always managed very well.  Cephus ShellingBURGER,Denham Mose 03/09/2016

## 2016-03-09 NOTE — Op Note (Signed)
Cesarean Section Procedure Note  Pre-operative Diagnosis: IUP at 38.5 weeks, Failure to progress, fetal intolerance to labor  Post-operative Diagnosis: same  Surgeon: Turner DanielsLOWE,Zaevion Parke C   Assistants: none  Anesthesia: epidural  Procedure:  Low Segment Transverse cesarean section  Procedure Details  The patient was seen in the Holding Room. The risks, benefits, complications, treatment options, and expected outcomes were discussed with the patient.  The patient concurred with the proposed plan, giving informed consent.  The site of surgery properly noted/marked.. A Time Out was held and the above information confirmed.  After induction of anesthesia, the patient was draped and prepped in the usual sterile manner. A Pfannenstiel incision was made and carried down through the subcutaneous tissue to the fascia. Fascial incision was made and extended transversely. The fascia was separated from the underlying rectus tissue superiorly and inferiorly. The peritoneum was identified and entered. Peritoneal incision was extended longitudinally. The utero-vesical peritoneal reflection was incised transversely and the bladder flap was bluntly freed from the lower uterine segment. A low transverse uterine incision was made. Delivered from vertex OP presentation was a baby with Apgar scores of 9 at one minute and 9 at five minutes. After the umbilical cord was clamped and cut cord blood was obtained for evaluation. The placenta was removed intact and appeared normal. The uterine outline, tubes and ovaries appeared normal. The uterine incision was closed with running locked sutures of 0 monocryl and imbricated with 0 monocryl. Hemostasis was observed. Lavage was carried out until clear. The peritoneum was then closed with 0 monocryl and rectus muscles plicated in the midline.  After hemostasis was assured, the fascia was then reapproximated with running sutures of 0 Vicryl. Irrigation was applied and after adequate  hemostasis was assured, the skin was reapproximated with subcutaneous sutures using 4-0 monocryl.  Instrument, sponge, and needle counts were correct prior the abdominal closure and at the conclusion of the case. The patient received 2 grams cefotetan preoperatively.  Findings: Viable female  Estimated Blood Loss:  600cc         Specimens: Placenta was sent to labor and delivery         Complications:  None

## 2016-03-09 NOTE — Progress Notes (Signed)
Patient ID: Damaris SchoonerJulie Boyd, female   DOB: 1986-08-03, 30 y.o.   MRN: [id number] The has been no Cx change for the last 6+ hours, despite adequate MVU with pitocin.  Currently 3-4 cm. Had several prolonged decels earlier that required Pit off, but recovered nicely and pit restarted.  Now with occas late decels but good accels and variability Plan primary LSTCS Risks and benefits of C/S were discussed.  All questions were answered and informed consent was obtained.  Plan to proceed with low segment transverse Cesarean Section.

## 2016-03-09 NOTE — H&P (Signed)
Colleen Boyd is a 30 y.o. female presenting for PROM clear fluid at 330.  Preg uncomplicated. GBS-. History OB History    Gravida Para Term Preterm AB TAB SAB Ectopic Multiple Living   2    1  1         Past Medical History  Diagnosis Date  . Asthma    History reviewed. No pertinent past surgical history. Family History: family history includes Hypertension in her maternal grandmother. Social History:  reports that she has never smoked. She does not have any smokeless tobacco history on file. She reports that she drinks alcohol. She reports that she does not use illicit drugs.   Prenatal Transfer Tool  Maternal Diabetes: No Genetic Screening: Normal Maternal Ultrasounds/Referrals: Normal Fetal Ultrasounds or other Referrals:  None Maternal Substance Abuse:  No Significant Maternal Medications:  None Significant Maternal Lab Results:  None Other Comments:  None  ROS  Dilation: 1 Effacement (%): Thick Station: -1 Exam by:: DCALLAWAY, RN Blood pressure 121/68, pulse 95, temperature 97.8 F (36.6 C), temperature source Axillary, resp. rate 18, height 5\' 5"  (1.651 m), weight 205 lb 12 oz (93.328 kg), unknown if currently breastfeeding. Exam Physical Exam  Prenatal labs: ABO, Rh: --/--/O POS (07/21 [id number]) Antibody: NEG (07/21 [id number]) Rubella: Immune (01/26 0000) RPR: Nonreactive (01/26 0000)  HBsAg: Negative (01/26 0000)  HIV: Non-reactive (01/26 0000)  GBS: Negative (07/21 0000)   Assessment/Plan: IUP at term PROM Discussed Pitocin IOL.  Anticipate SVD   Colleen Boyd C 03/09/2016, 9:01 AM

## 2016-03-09 NOTE — Anesthesia Preprocedure Evaluation (Signed)
Anesthesia Evaluation  Patient identified by MRN, date of birth, ID band Patient awake    Reviewed: Allergy & Precautions, NPO status , Patient's Chart, lab work & pertinent test results  Airway Mallampati: II  TM Distance: >3 FB Neck ROM: Full    Dental  (+) Teeth Intact   Pulmonary asthma ,    breath sounds clear to auscultation       Cardiovascular negative cardio ROS   Rhythm:Regular Rate:Normal     Neuro/Psych negative neurological ROS  negative psych ROS   GI/Hepatic negative GI ROS, Neg liver ROS,   Endo/Other  negative endocrine ROS  Renal/GU negative Renal ROS  negative genitourinary   Musculoskeletal negative musculoskeletal ROS (+)   Abdominal   Peds negative pediatric ROS (+)  Hematology negative hematology ROS (+)   Anesthesia Other Findings   Reproductive/Obstetrics (+) Pregnancy                             Lab Results  Component Value Date   WBC 19.4* 03/09/2016   HGB 10.5* 03/09/2016   HCT 31.5* 03/09/2016   MCV 73.6* 03/09/2016   PLT 299 03/09/2016   No results found for: INR, PROTIME   Anesthesia Physical Anesthesia Plan  ASA: II  Anesthesia Plan: Epidural   Post-op Pain Management:    Induction:   Airway Management Planned:   Additional Equipment:   Intra-op Plan:   Post-operative Plan:   Informed Consent: I have reviewed the patients History and Physical, chart, labs and discussed the procedure including the risks, benefits and alternatives for the proposed anesthesia with the patient or authorized representative who has indicated his/her understanding and acceptance.     Plan Discussed with:   Anesthesia Plan Comments:         Anesthesia Quick Evaluation

## 2016-03-09 NOTE — Anesthesia Procedure Notes (Signed)
Epidural Patient location during procedure: OB Start time: 03/09/2016 11:28 AM End time: 03/09/2016 11:34 AM  Staffing Anesthesiologist: Shona SimpsonHOLLIS, Jennie Bolar D Performed by: anesthesiologist   Preanesthetic Checklist Completed: patient identified, site marked, surgical consent, pre-op evaluation, timeout performed, IV checked, risks and benefits discussed and monitors and equipment checked  Epidural Patient position: sitting Prep: ChloraPrep Patient monitoring: heart rate, continuous pulse ox and blood pressure Approach: midline Location: L3-L4 Injection technique: LOR saline  Needle:  Needle type: Tuohy  Needle gauge: 17 G Needle length: 9 cm Catheter type: closed end flexible Catheter size: 20 Guage Test dose: negative and 1.5% lidocaine  Assessment Events: blood not aspirated, injection not painful, no injection resistance and no paresthesia  Additional Notes LOR @ 5  Patient identified. Risks/Benefits/Options discussed with patient including but not limited to bleeding, infection, nerve damage, paralysis, failed block, incomplete pain control, headache, blood pressure changes, nausea, vomiting, reactions to medications, itching and postpartum back pain. Confirmed with bedside nurse the patient's most recent platelet count. Confirmed with patient that they are not currently taking any anticoagulation, have any bleeding history or any family history of bleeding disorders. Patient expressed understanding and wished to proceed. All questions were answered. Sterile technique was used throughout the entire procedure. Please see nursing notes for vital signs. Test dose was given through epidural catheter and negative prior to continuing to dose epidural or start infusion. Warning signs of high block given to the patient including shortness of breath, tingling/numbness in hands, complete motor block, or any concerning symptoms with instructions to call for help. Patient was given instructions on  fall risk and not to get out of bed. All questions and concerns addressed with instructions to call with any issues or inadequate analgesia.    Reason for block:procedure for pain

## 2016-03-09 NOTE — MAU Note (Signed)
PT  SAYS  SROM AT 0300- CLEAR FLUID   VE IN OFFICE  YESTERDAY   1  CM.   DENIES HSV   AND  MRSA.  GBS-  NEG

## 2016-03-10 ENCOUNTER — Encounter (HOSPITAL_COMMUNITY): Payer: Self-pay | Admitting: *Deleted

## 2016-03-10 LAB — CBC
HCT: 27.9 % — ABNORMAL LOW (ref 36.0–46.0)
Hemoglobin: 9.2 g/dL — ABNORMAL LOW (ref 12.0–15.0)
MCH: 24.3 pg — AB (ref 26.0–34.0)
MCHC: 33 g/dL (ref 30.0–36.0)
MCV: 73.6 fL — ABNORMAL LOW (ref 78.0–100.0)
PLATELETS: 252 10*3/uL (ref 150–400)
RBC: 3.79 MIL/uL — AB (ref 3.87–5.11)
RDW: 16.2 % — AB (ref 11.5–15.5)
WBC: 23 10*3/uL — AB (ref 4.0–10.5)

## 2016-03-10 LAB — RPR: RPR Ser Ql: NONREACTIVE

## 2016-03-10 MED ORDER — MENTHOL 3 MG MT LOZG: 1.0000 | LOZENGE | OROMUCOSAL | Status: DC | PRN

## 2016-03-10 MED ORDER — PRENATAL MULTIVITAMIN CH
1.0000 | ORAL_TABLET | Freq: Every day | ORAL | Status: DC
  Administered 2016-03-10 – 2016-03-11 (×2): 1 via ORAL
  Filled 2016-03-10 (×2): qty 1

## 2016-03-10 MED ORDER — NALBUPHINE HCL 10 MG/ML IJ SOLN
5.0000 mg | Freq: Once | INTRAMUSCULAR | Status: DC | PRN
Start: 2016-03-10 — End: 2016-03-12

## 2016-03-10 MED ORDER — SENNOSIDES-DOCUSATE SODIUM 8.6-50 MG PO TABS
2.0000 | ORAL_TABLET | ORAL | Status: DC
Start: 2016-03-10 — End: 2016-03-12
  Administered 2016-03-10 – 2016-03-11 (×3): 2 via ORAL
  Filled 2016-03-10 (×3): qty 2

## 2016-03-10 MED ORDER — DIPHENHYDRAMINE HCL 25 MG PO CAPS: 25.0000 mg | ORAL_CAPSULE | Freq: Four times a day (QID) | ORAL | Status: DC | PRN

## 2016-03-10 MED ORDER — ONDANSETRON HCL 4 MG/2ML IJ SOLN: 4.0000 mg | Freq: Three times a day (TID) | INTRAMUSCULAR | Status: DC | PRN

## 2016-03-10 MED ORDER — SCOPOLAMINE 1 MG/3DAYS TD PT72
1.0000 | MEDICATED_PATCH | Freq: Once | TRANSDERMAL | Status: DC
  Filled 2016-03-10: qty 1

## 2016-03-10 MED ORDER — OXYTOCIN 40 UNITS IN LACTATED RINGERS INFUSION - SIMPLE MED: 2.5000 [IU]/h | INTRAVENOUS | Status: AC

## 2016-03-10 MED ORDER — SIMETHICONE 80 MG PO CHEW
80.0000 mg | CHEWABLE_TABLET | ORAL | Status: DC
  Administered 2016-03-10 – 2016-03-11 (×3): 80 mg via ORAL
  Filled 2016-03-10 (×3): qty 1

## 2016-03-10 MED ORDER — SIMETHICONE 80 MG PO CHEW: 80.0000 mg | CHEWABLE_TABLET | ORAL | Status: DC | PRN

## 2016-03-10 MED ORDER — NALOXONE HCL 2 MG/2ML IJ SOSY
1.0000 ug/kg/h | PREFILLED_SYRINGE | INTRAVENOUS | Status: DC | PRN
  Filled 2016-03-10: qty 2

## 2016-03-10 MED ORDER — NALBUPHINE HCL 10 MG/ML IJ SOLN: 5.0000 mg | INTRAMUSCULAR | Status: DC | PRN

## 2016-03-10 MED ORDER — ACETAMINOPHEN 325 MG PO TABS
650.0000 mg | ORAL_TABLET | ORAL | Status: DC | PRN
  Administered 2016-03-10 – 2016-03-11 (×2): 650 mg via ORAL
  Filled 2016-03-10 (×2): qty 2

## 2016-03-10 MED ORDER — DIPHENHYDRAMINE HCL 25 MG PO CAPS: 25.0000 mg | ORAL_CAPSULE | ORAL | Status: DC | PRN

## 2016-03-10 MED ORDER — COCONUT OIL OIL: 1.0000 "application " | TOPICAL_OIL | Status: DC | PRN

## 2016-03-10 MED ORDER — WITCH HAZEL-GLYCERIN EX PADS: 1.0000 "application " | MEDICATED_PAD | CUTANEOUS | Status: DC | PRN

## 2016-03-10 MED ORDER — NALBUPHINE HCL 10 MG/ML IJ SOLN: 5.0000 mg | Freq: Once | INTRAMUSCULAR | Status: DC | PRN

## 2016-03-10 MED ORDER — ACETAMINOPHEN 500 MG PO TABS
1000.0000 mg | ORAL_TABLET | Freq: Four times a day (QID) | ORAL | Status: AC
  Administered 2016-03-10 – 2016-03-11 (×4): 1000 mg via ORAL
  Filled 2016-03-10 (×4): qty 2

## 2016-03-10 MED ORDER — LACTATED RINGERS IV SOLN: INTRAVENOUS | Status: DC

## 2016-03-10 MED ORDER — NALOXONE HCL 0.4 MG/ML IJ SOLN: 0.4000 mg | INTRAMUSCULAR | Status: DC | PRN

## 2016-03-10 MED ORDER — TETANUS-DIPHTH-ACELL PERTUSSIS 5-2.5-18.5 LF-MCG/0.5 IM SUSP: 0.5000 mL | Freq: Once | INTRAMUSCULAR | Status: DC

## 2016-03-10 MED ORDER — DIBUCAINE 1 % RE OINT: 1.0000 "application " | TOPICAL_OINTMENT | RECTAL | Status: DC | PRN

## 2016-03-10 MED ORDER — DIPHENHYDRAMINE HCL 50 MG/ML IJ SOLN: 12.5000 mg | INTRAMUSCULAR | Status: DC | PRN

## 2016-03-10 MED ORDER — IBUPROFEN 600 MG PO TABS
600.0000 mg | ORAL_TABLET | Freq: Four times a day (QID) | ORAL | Status: DC
  Administered 2016-03-10 – 2016-03-12 (×9): 600 mg via ORAL
  Filled 2016-03-10 (×9): qty 1

## 2016-03-10 MED ORDER — ZOLPIDEM TARTRATE 5 MG PO TABS: 5.0000 mg | ORAL_TABLET | Freq: Every evening | ORAL | Status: DC | PRN

## 2016-03-10 MED ORDER — SIMETHICONE 80 MG PO CHEW
80.0000 mg | CHEWABLE_TABLET | Freq: Three times a day (TID) | ORAL | Status: DC
  Administered 2016-03-10 – 2016-03-12 (×4): 80 mg via ORAL
  Filled 2016-03-10 (×5): qty 1

## 2016-03-10 MED ORDER — SODIUM CHLORIDE 0.9% FLUSH: 3.0000 mL | INTRAVENOUS | Status: DC | PRN

## 2016-03-10 NOTE — Progress Notes (Signed)
Subjective: Postpartum Day 1: Cesarean Delivery Patient reports tolerating PO.    Objective: Vital signs in last 24 hours: Temp:  [97.4 F (36.3 C)-98.5 F (36.9 C)] 98.2 F (36.8 C) (07/22 0500) Pulse Rate:  [84-128] 86 (07/22 0340) Resp:  [16-23] 18 (07/22 0500) BP: (96-137)/(45-87) 111/67 mmHg (07/22 0340) SpO2:  [93 %-100 %] 95 % (07/22 0500)  Physical Exam:  General: alert, cooperative, appears stated age and no distress Lochia: appropriate Uterine Fundus: firm Incision: healing well DVT Evaluation: No evidence of DVT seen on physical exam.   Recent Labs  03/09/16 0620 03/10/16 0603  HGB 10.5* 9.2*  HCT 31.5* 27.9*    Assessment/Plan: Status post Cesarean section. Doing well postoperatively.  Continue current care.  Anyia Gierke C 03/10/2016, 7:42 AM

## 2016-03-10 NOTE — Anesthesia Postprocedure Evaluation (Signed)
Anesthesia Post Note  Patient: Colleen Boyd  Procedure(s) Performed: Procedure(s) (LRB): CESAREAN SECTION (N/A)  Patient location during evaluation: Mother Baby Anesthesia Type: Epidural Level of consciousness: awake Pain management: satisfactory to patient Vital Signs Assessment: post-procedure vital signs reviewed and stable Respiratory status: spontaneous breathing Cardiovascular status: stable Anesthetic complications: no     Last Vitals:  Filed Vitals:   03/10/16 0340 03/10/16 0500  BP: 111/67   Pulse: 86   Temp: 36.9 C 36.8 C  Resp: 18 18    Last Pain:  Filed Vitals:   03/10/16 0612  PainSc: 4    Pain Goal:                 Cephus Shelling

## 2016-03-10 NOTE — Progress Notes (Signed)
Provider at bedside, decision made on C-section delivery.  Risk and benefits reviewed and discussed with Pt and spouse.  Questions answered and consent signed.

## 2016-03-10 NOTE — Lactation Note (Signed)
This note was copied from a baby's chart. Mom states she would like to pump & bottle feed her baby, that is her plan to do once she goes home.  I explained that we can set her up with a pump, but if she doesn't produce enough colostrum at first there may be a need to supplement until her milk comes in (3-5 days).  Mom stated understanding, and is ok putting baby to the breast for now.  I encouraged her to let us know if she changed her mind.

## 2016-03-10 NOTE — Anesthesia Postprocedure Evaluation (Signed)
Anesthesia Post Note  Patient: Colleen Boyd  Procedure(s) Performed: Procedure(s) (LRB): CESAREAN SECTION (N/A)  Patient location during evaluation: PACU Anesthesia Type: Epidural Level of consciousness: awake Pain management: satisfactory to patient Vital Signs Assessment: post-procedure vital signs reviewed and stable Respiratory status: spontaneous breathing Cardiovascular status: blood pressure returned to baseline Postop Assessment: no headache and spinal receding Anesthetic complications: no     Last Vitals:  Filed Vitals:   03/10/16 0015 03/10/16 0030  BP: 119/77 114/73  Pulse: 109 109  Temp: 36.5 C   Resp: 18 23    Last Pain:  Filed Vitals:   03/10/16 0034  PainSc: 0-No pain   Pain Goal:                 Jiles Garter

## 2016-03-10 NOTE — Lactation Note (Signed)
This note was copied from a baby's chart. Lactation Consultation Note  Patient Name: Colleen Boyd [id number] Date: 03/10/2016 Reason for consult: Initial assessment Mom reports baby has been spitty/sleepy today. Demonstrated awakening techniques/hand expression. Mom's nipples are flat with some aerola edema, had Mom use hand pump to pre-pump, nipple compressible. Using breast compression, baby did latch and demonstrate few suckling bursts. After few suckles baby would fall asleep, latch became more shallow but baby would re-latch and take few more suckles again. Baby nursed off/on for 10 minutes. Demonstrated to FOB how to assist Mom with breast compression to help with latch. Colostrum present with hand expression. Encouraged Mom to continue to BF with feeding ques, STS. Basic teaching reviewed.  Use hand pump to pre-pump to help with latch, given breast shells to wear to relieve aerola swelling. Lactation brochure left for review, advised of OP services and support group. Encouraged to call for assist with latch.   Maternal Data Has patient been taught Hand Expression?: Yes Does the patient have breastfeeding experience prior to this delivery?: No  Feeding Feeding Type: Breast Fed Length of feed: 10 min  LATCH Score/Interventions Latch: Repeated attempts needed to sustain latch, nipple held in mouth throughout feeding, stimulation needed to elicit sucking reflex. Intervention(s): Adjust position;Assist with latch;Breast massage;Breast compression  Audible Swallowing: A few with stimulation  Type of Nipple: Flat Intervention(s): Shells;Hand pump  Comfort (Breast/Nipple): Soft / non-tender     Hold (Positioning): Assistance needed to correctly position infant at breast and maintain latch. Intervention(s): Breastfeeding basics reviewed;Support Pillows;Position options;Skin to skin  LATCH Score: 6  Lactation Tools Discussed/Used Tools: Shells;Pump Shell Type: Inverted Breast pump  type: Manual WIC Program: No   Consult Status Consult Status: Follow-up Date: 03/11/16 Follow-up type: In-patient    Alfred Levins 03/10/2016, 5:58 PM

## 2016-03-10 NOTE — Addendum Note (Signed)
Addendum  created 03/10/16 1017 by Algis Greenhouse, CRNA   Modules edited: Clinical Notes   Clinical Notes:  File: [id number]

## 2016-03-11 NOTE — Progress Notes (Signed)
Subjective: Postpartum Day 2: Cesarean Delivery Patient reports tolerating PO and no problems voiding.    Objective: Vital signs in last 24 hours: Temp:  [97.7 F (36.5 C)-98.3 F (36.8 C)] 98.3 F (36.8 C) (07/23 0600) Pulse Rate:  [70-100] 82 (07/23 0600) Resp:  [18] 18 (07/23 0600) BP: (92-101)/(51-64) 101/58 (07/23 0600) SpO2:  [99 %] 99 % (07/22 2100)  Physical Exam:  General: alert, cooperative, appears stated age and no distress Lochia: appropriate Uterine Fundus: firm Incision: healing well DVT Evaluation: No evidence of DVT seen on physical exam.   Recent Labs  03/09/16 0620 03/10/16 0603  HGB 10.5* 9.2*  HCT 31.5* 27.9*    Assessment/Plan: Status post Cesarean section. Doing well postoperatively.  Continue current care.  Colleen Boyd C 03/11/2016, 8:50 AM

## 2016-03-12 MED ORDER — IBUPROFEN 600 MG PO TABS: 600.0000 mg | ORAL_TABLET | Freq: Four times a day (QID) | ORAL | 0 refills | Status: AC

## 2016-03-12 MED ORDER — OXYCODONE-ACETAMINOPHEN 5-325 MG PO TABS: 1.0000 | ORAL_TABLET | ORAL | Status: DC | PRN

## 2016-03-12 MED ORDER — OXYCODONE-ACETAMINOPHEN 5-325 MG PO TABS: 1.0000 | ORAL_TABLET | ORAL | 0 refills | Status: DC | PRN

## 2016-03-12 NOTE — Discharge Summary (Signed)
Obstetric Discharge Summary Reason for Admission: onset of labor and rupture of membranes Prenatal Procedures: none Intrapartum Procedures: cesarean: low cervical, transverse Postpartum Procedures: none Complications-Operative and Postpartum: none Hemoglobin  Date Value Ref Range Status  03/10/2016 9.2 (L) 12.0 - 15.0 g/dL Final   HCT  Date Value Ref Range Status  03/10/2016 27.9 (L) 36.0 - 46.0 % Final    Physical Exam:  General: alert, cooperative and appears stated age 30: appropriate Uterine Fundus: firm Incision: healing well, no significant drainage DVT Evaluation: No evidence of DVT seen on physical exam.  Discharge Diagnoses: Term Pregnancy-delivered  Discharge Information: Date: 03/12/2016 Activity: pelvic rest Diet: routine Medications: Ibuprofen and Percocet Condition: improved Instructions: refer to practice specific booklet Discharge to: home   Newborn Data: Live born female  Birth Weight: 6 lb 7 oz (2920 g) APGAR: 9, 9  Home with mother.  Carvel Huskins L 03/12/2016, 8:08 AM

## 2016-03-12 NOTE — Lactation Note (Signed)
This note was copied from a baby's chart. Lactation Consultation Note  Patient Name: Colleen Boyd [id number] Date: 03/12/2016 Reason for consult: Follow-up assessment   Maternal Data    Feeding Feeding Type: Breast Fed Length of feed: 25 min  LATCH Score/Interventions    Audible Swallowing:  (easily expressed colostrum, less edematous today)                 Lactation Tools Discussed/Used     Consult Status    Baby    Baby getting cirumcusion at this time, mom reports breast feeding going well, mom's breasts less edematous today, nipples evert with hand expression, and has easily expressed colostrum. Mom will call for questions/concerns.    Colleen Boyd 03/12/2016, 8:40 AM

## 2019-04-01 ENCOUNTER — Other Ambulatory Visit: Payer: Self-pay

## 2019-04-01 ENCOUNTER — Ambulatory Visit: Payer: BC Managed Care – PPO | Admitting: Family Medicine

## 2019-04-01 ENCOUNTER — Encounter: Payer: Self-pay | Admitting: Family Medicine

## 2019-04-01 VITALS — BP 104/62 | HR 100 | Temp 98.3°F | Ht 65.0 in | Wt 194.4 lb

## 2019-04-01 DIAGNOSIS — E669 Obesity, unspecified: Secondary | ICD-10-CM | POA: Diagnosis not present

## 2019-04-01 DIAGNOSIS — Z308 Encounter for other contraceptive management: Secondary | ICD-10-CM | POA: Diagnosis not present

## 2019-04-01 DIAGNOSIS — F3289 Other specified depressive episodes: Secondary | ICD-10-CM

## 2019-04-01 DIAGNOSIS — Z23 Encounter for immunization: Secondary | ICD-10-CM | POA: Diagnosis not present

## 2019-04-01 DIAGNOSIS — Z7689 Persons encountering health services in other specified circumstances: Secondary | ICD-10-CM | POA: Diagnosis not present

## 2019-04-01 LAB — CBC WITH DIFFERENTIAL/PLATELET
Basophils Absolute: 0.1 10*3/uL (ref 0.0–0.1)
Basophils Relative: 0.8 % (ref 0.0–3.0)
Eosinophils Absolute: 0.5 10*3/uL (ref 0.0–0.7)
Eosinophils Relative: 4.6 % (ref 0.0–5.0)
HCT: 39.8 % (ref 36.0–46.0)
Hemoglobin: 13.1 g/dL (ref 12.0–15.0)
Lymphocytes Relative: 21.7 % (ref 12.0–46.0)
Lymphs Abs: 2.2 10*3/uL (ref 0.7–4.0)
MCHC: 33 g/dL (ref 30.0–36.0)
MCV: 82.1 fl (ref 78.0–100.0)
Monocytes Absolute: 0.7 10*3/uL (ref 0.1–1.0)
Monocytes Relative: 6.4 % (ref 3.0–12.0)
Neutro Abs: 6.9 10*3/uL (ref 1.4–7.7)
Neutrophils Relative %: 66.5 % (ref 43.0–77.0)
Platelets: 358 10*3/uL (ref 150.0–400.0)
RBC: 4.85 Mil/uL (ref 3.87–5.11)
RDW: 14.7 % (ref 11.5–15.5)
WBC: 10.3 10*3/uL (ref 4.0–10.5)

## 2019-04-01 LAB — COMPREHENSIVE METABOLIC PANEL
ALT: 17 U/L (ref 0–35)
AST: 16 U/L (ref 0–37)
Albumin: 4.5 g/dL (ref 3.5–5.2)
Alkaline Phosphatase: 84 U/L (ref 39–117)
BUN: 16 mg/dL (ref 6–23)
CO2: 26 mEq/L (ref 19–32)
Calcium: 9.9 mg/dL (ref 8.4–10.5)
Chloride: 104 mEq/L (ref 96–112)
Creatinine, Ser: 0.91 mg/dL (ref 0.40–1.20)
GFR: 85.97 mL/min (ref >60.00)
Glucose, Bld: 84 mg/dL (ref 70–99)
Potassium: 4.2 mEq/L (ref 3.5–5.1)
Sodium: 138 mEq/L (ref 135–145)
Total Bilirubin: 0.4 mg/dL (ref 0.2–1.2)
Total Protein: 7.8 g/dL (ref 6.0–8.3)

## 2019-04-01 LAB — LIPID PANEL
Cholesterol: 204 mg/dL — ABNORMAL HIGH (ref 0–200)
HDL: 57.9 mg/dL (ref >39.00)
LDL Cholesterol: 131 mg/dL — ABNORMAL HIGH (ref 0–99)
NonHDL: 145.77
Total CHOL/HDL Ratio: 4
Triglycerides: 73 mg/dL (ref 0.0–149.0)
VLDL: 14.6 mg/dL (ref 0.0–40.0)

## 2019-04-01 LAB — TSH: TSH: 1.71 u[IU]/mL (ref 0.35–4.50)

## 2019-04-01 LAB — VITAMIN D 25 HYDROXY (VIT D DEFICIENCY, FRACTURES): VITD: 47.22 ng/mL (ref 30.00–100.00)

## 2019-04-01 MED ORDER — NORETHIN ACE-ETH ESTRAD-FE 1-20 MG-MCG(24) PO TABS: 1.0000 | ORAL_TABLET | Freq: Every day | ORAL | 4 refills | Status: AC

## 2019-04-01 MED ORDER — BUPROPION HCL ER (XL) 150 MG PO TB24: 150.0000 mg | ORAL_TABLET | Freq: Every day | ORAL | 2 refills | Status: DC

## 2019-04-01 NOTE — Patient Instructions (Addendum)
It was a pleasure to meet you today! I look forward to partnering with you for your health care needs  Please follow up in 8-10 weeks- can be a virtual visit. If you are having any worsening symptoms or problems with medication, get in touch sooner.   I will notify you of lab results  Oral Contraception Use Oral contraceptive pills (OCPs) are medicines that you take to prevent pregnancy. OCPs work by:  Preventing the ovaries from releasing eggs.  Thickening mucus in the lower part of the uterus (cervix), which prevents sperm from entering the uterus.  Thinning the lining of the uterus (endometrium), which prevents a fertilized egg from attaching to the endometrium. OCPs are highly effective when taken exactly as prescribed. However, OCPs do not prevent sexually transmitted infections (STIs). Safe sex practices, such as using condoms while on an OCP, can help prevent STIs. Before taking OCPs, you may have a physical exam, blood test, and Pap test. A Pap test involves taking a sample of cells from your cervix to check for cancer. Discuss with your health care provider the possible side effects of the OCP you may be prescribed. When you start an OCP, be aware that it can take 2-3 months for your body to adjust to changes in hormone levels. How to take oral contraceptive pills Follow instructions from your health care provider about how to start taking your first cycle of OCPs. Your health care provider may recommend that you:  Start the pill on day 1 of your menstrual period. If you start at this time, you will not need any backup form of birth control (contraception), such as condoms.  Start the pill on the first Sunday after your menstrual period or on the day you get your prescription. In these cases, you will need to use backup contraception for the first week.  Start the pill at any time of your cycle. ? If you take the pill within 5 days of the start of your period, you will not need a  backup form of contraception. ? If you start at any other time of your menstrual cycle, you will need to use another form of contraception for 7 days. If your OCP is the type called a minipill, it will protect you from pregnancy after taking it for 2 days (48 hours), and you can stop using backup contraception after that time. After you have started taking OCPs:  If you forget to take 1 pill, take it as soon as you remember. Take the next pill at the regular time.  If you miss 2 or more pills, call your health care provider. Different pills have different instructions for missed doses. Use backup birth control until your next menstrual period starts.  If you use a 28-day pack that contains inactive pills and you miss 1 of the last 7 pills (pills with no hormones), throw away the rest of the non-hormone pills and start a new pill pack. No matter which day you start the OCP, you will always start a new pack on that same day of the week. Have an extra pack of OCPs and a backup contraceptive method available in case you miss some pills or lose your OCP pack. Follow these instructions at home:  Do not use any products that contain nicotine or tobacco, such as cigarettes and e-cigarettes. If you need help quitting, ask your health care provider.  Always use a condom to protect against STIs. OCPs do not protect against STIs.  Use  a calendar to mark the days of your menstrual period.  Read the information and directions that came with your OCP. Talk to your health care provider if you have questions. Contact a health care provider if:  You develop nausea and vomiting.  You have abnormal vaginal discharge or bleeding.  You develop a rash.  You miss your menstrual period. Depending on the type of OCP you are taking, this may be a sign of pregnancy. Ask your health care provider for more information.  You are losing your hair.  You need treatment for mood swings or depression.  You get dizzy  when taking the OCP.  You develop acne after taking the OCP.  You become pregnant or think you may be pregnant.  You have diarrhea, constipation, and abdominal pain or cramps.  You miss 2 or more pills. Get help right away if:  You develop chest pain.  You develop shortness of breath.  You have an uncontrolled or severe headache.  You develop numbness or slurred speech.  You develop visual or speech problems.  You develop pain, redness, and swelling in your legs.  You develop weakness or numbness in your arms or legs. Summary  Oral contraceptive pills (OCPs) are medicines that you take to prevent pregnancy.  OCPs do not prevent sexually transmitted infections (STIs). Always use a condom to protect against STIs.  When you start an OCP, be aware that it can take 2-3 months for your body to adjust to changes in hormone levels.  Read all the information and directions that come with your OCP. This information is not intended to replace advice given to you by your health care provider. Make sure you discuss any questions you have with your health care provider. Document Released: 07/26/2011 Document Revised: 11/28/2018 Document Reviewed: 09/17/2016 Elsevier Patient Education  2020 [employer]   How to help anxiety - without medication.   1) Regular Exercise - walking, jogging, cycling, dancing, strength training  2)  Begin a Mindfulness/Meditation practice -- this can take a little as 3 minutes and is helpful for all kinds of mood issues -- You can find resources in books -- Or you can download apps like  ---- Headspace App (which currently has free content called "Weathering the Storm") ---- Calm (which has a few free options)  ---- Insignt Timer ---- Stop, Breathe & Think  # With each of these Apps - you should decline the "start free trial" offer and as you search through the App should be able to access some of their free content. You can also chose to pay for the  content if you find one that works well for you.   # Many of them also offer sleep specific content which may help with insomnia  3) Healthy Diet -- Avoid or decrease Caffeine -- Avoid or decrease Alcohol -- Drink plenty of water, have a balanced diet -- Avoid cigarettes and marijuana (as well as other recreational drugs)  4) Consider contacting a professional therapist

## 2019-04-01 NOTE — Progress Notes (Signed)
Subjective:    Patient ID: Colleen Boyd, female    DOB: 22-Jan-1986, 33 y.o.   MRN: [id number]  HPI This is a 33 yo female who presents today to establish care. Has been seeing gyn. Has a 87 year old son. 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 Increased anxiety during the school year, better with remote learning. Lives with boyfriend and son.   Last CPE- 3 years ago Pap- 2 years ago Tdap- overdue, will have today Exercise- walking 4x week Sleep- tosses and turns, falls asleep easily  Depression- has been worse lately. Has started therapy recently. Is interested in medication. Her therapist suggested Wellbutrin. She has never been on medication before for depression. Feels that she is able to get her school work done but has no energy or motivation to do things at home. Lacking balance. Feels pulled by work and family responsibilities.   Contraception- has been on OCPs in the past. Has been off for 8-9 months. No history of migraine, no personal or family history of blood clots.   ROS- no chest pain, no SOB,  Past Medical History:  Diagnosis Date  . Asthma   . Depression    Past Surgical History:  Procedure Laterality Date  . CESAREAN SECTION N/A 03/09/2016   Procedure: CESAREAN SECTION;  Surgeon: Louretta Shorten, MD;  Location: [facility];  Service: Obstetrics;  Laterality: N/A;   Family History  Problem Relation Age of Onset  . Hypertension Maternal Grandmother    Social History   Tobacco Use  . Smoking status: Never Smoker  . Smokeless tobacco: Never Used  Substance Use Topics  . Alcohol use: Yes    Comment: ocassionallNONE  WHILE  PREG  . Drug use: No      Review of Systems Per HPI    Objective:   Physical Exam Vitals signs reviewed.  Constitutional:      General: She is not in acute distress.    Appearance: Normal appearance. She is obese. She is not ill-appearing, toxic-appearing or diaphoretic.  HENT:     Head:  Normocephalic and atraumatic.  Cardiovascular:     Rate and Rhythm: Normal rate.  Pulmonary:     Effort: Pulmonary effort is normal.  Neurological:     Mental Status: She is alert and oriented to person, place, and time.  Psychiatric:        Mood and Affect: Mood normal.        Behavior: Behavior normal.        Thought Content: Thought content normal.        Judgment: Judgment normal.      BP 104/62 (BP Location: Left Arm, Patient Position: Sitting, Cuff Size: Normal)   Pulse 100   Temp 98.3 F (36.8 C) (Temporal)   Ht 5\' 5"  (1.651 m)   Wt 194 lb 6.4 oz (88.2 kg)   SpO2 98%   BMI 32.35 kg/m  Depression screen [facility] 2/9 04/01/2019  Decreased Interest 2  Down, Depressed, Hopeless 2  PHQ - 2 Score 4  Altered sleeping 2  Tired, decreased energy 3  Change in appetite 1  Feeling bad or failure about yourself  2  Trouble concentrating 0  Moving slowly or fidgety/restless 0  Suicidal thoughts 0  PHQ-9 Score 12  Difficult doing work/chores Very difficult       Assessment & Plan:  1. Encounter to establish care - available EMR records reviewed  - health maintenance reviewed  2. Other depression - discussed holistic approach including continuing with therapy, daily walking, working on improved sleep - buPROPion (WELLBUTRIN XL) 150 MG 24 hr tablet; Take 1 tablet (150 mg total) by mouth daily.  Dispense: 30 tablet; Refill: 2 -Follow-up in 8 to 10 weeks, sooner if worsening symptoms or intolerance to medication  3. Obesity (BMI 30.0-34.9) - Comprehensive metabolic panel - Lipid Panel - CBC with Differential - Vitamin D, 25-hydroxy - TSH  4. Encounter for other contraceptive management -Provided information about oral contraception, starting pills as well as what to do with missed pills - Norethindrone Acetate-Ethinyl Estrad-FE (LOESTRIN 24 FE) 1-20 MG-MCG(24) tablet; Take 1 tablet by mouth daily.  Dispense: 3 Package; Refill: 4  5. Need for Tdap vaccination - Tdap  vaccine greater than or equal to 7yo IM   Olean Reeeborah Yohannes Waibel, FNP-BC  [facility], [facility]  04/01/2019 10:28 AM

## 2019-04-03 ENCOUNTER — Telehealth: Payer: Self-pay

## 2019-04-03 NOTE — Telephone Encounter (Signed)
[facility] Night - Client Nonclinical Telephone Record AccessNurse Client [place] Night - Client Client Site [place] - Night Physician [facility] - PHYSICIAN, Verita Schneiders- MD Contact Type Call Who Is Calling Patient / Member / Family / Caregiver Caller Name Colleen Boyd Caller Phone Number [phone] Call Type Message Only Information Provided Reason for Call Returning a Call from the Office Initial Colleen Boyd states she missed call from office regarding lab work Additional Comment Call Closed By: Ignacia Marvel Transaction Date/Time: 04/02/2019 5:20:44 PM (ET)

## 2019-04-03 NOTE — Telephone Encounter (Signed)
Spoke with patient, aware of results.  See result note. Nothing further needed.

## 2019-04-22 ENCOUNTER — Other Ambulatory Visit: Payer: Self-pay | Admitting: Family Medicine

## 2019-04-22 ENCOUNTER — Telehealth: Payer: Self-pay | Admitting: *Deleted

## 2019-04-22 MED ORDER — ALBUTEROL SULFATE HFA 108 (90 BASE) MCG/ACT IN AERS: 2.0000 | INHALATION_SPRAY | RESPIRATORY_TRACT | 0 refills | Status: DC | PRN

## 2019-04-22 NOTE — Telephone Encounter (Signed)
Called patient. She has been having symptoms for about 8 days. No fever. Breathing ok lying down, feels more SOB with sitting and standing. Little sputum production. Albuterol inhaler sent in. ER precautions reviewed.

## 2019-04-22 NOTE — Telephone Encounter (Signed)
Patent called stating that she just tested positive for covid about a week ago at a drive thru testing site. Patient stated that she does not have a fever, but has a bad cough, some difficulty breathing and some SOB. Patient stated that the difficulty breathing and SOB started about two days ago. Patient stated that she does have a history of asthma, but has not had to use an inhaler for a while. Patient stated the old inhaler that she has which is Albuterol 200 meters expired 2012. Patient wants to know if Tor Netters NP will send in an inhaler for her to use? Pharmacy [facility]

## 2019-04-23 ENCOUNTER — Other Ambulatory Visit: Payer: Self-pay | Admitting: Family Medicine

## 2019-04-23 DIAGNOSIS — F3289 Other specified depressive episodes: Secondary | ICD-10-CM

## 2019-05-25 ENCOUNTER — Ambulatory Visit (INDEPENDENT_AMBULATORY_CARE_PROVIDER_SITE_OTHER): Payer: BC Managed Care – PPO | Admitting: Family Medicine

## 2019-05-25 ENCOUNTER — Encounter: Payer: Self-pay | Admitting: Family Medicine

## 2019-05-25 DIAGNOSIS — F3289 Other specified depressive episodes: Secondary | ICD-10-CM

## 2019-05-25 MED ORDER — BUPROPION HCL ER (XL) 150 MG PO TB24: 150.0000 mg | ORAL_TABLET | Freq: Every day | ORAL | 0 refills | Status: DC

## 2019-05-25 NOTE — Progress Notes (Signed)
Virtual Visit via Video Note  I connected with Colleen Boyd on 05/25/19 at  3:30 PM EDT by a video enabled telemedicine application and verified that I am speaking with the correct person using two identifiers.  Location: Patient: In her office Provider: [place]   I discussed the limitations of evaluation and management by telemedicine and the availability of in person appointments. The patient expressed understanding and agreed to proceed.  History of Present Illness: Chief Complaint  Patient presents with  . Depression    Pt feels like the medication is helping - she is feeling better.    This is a 33 yo female who established care here 08/09/2019.  She was started on bupropion XL 150 mg a day.  She reports significantly improved mood.  She feels "like myself."  She denies any side effects of medication and feels that she is doing well at this current dose.  She continues to have counseling which she finds helpful. She was restarted on her OCPs at the last visit and she denies any side effects with these.  Past Medical History:  Diagnosis Date  . Asthma   . Depression    Past Surgical History:  Procedure Laterality Date  . CESAREAN SECTION N/A 03/09/2016   Procedure: CESAREAN SECTION;  Surgeon: Louretta Shorten, MD;  Location: [facility];  Service: Obstetrics;  Laterality: N/A;   Family History  Problem Relation Age of Onset  . Alcohol abuse Mother   . Depression Mother   . Hypertension Mother   . Mental illness Mother   . Intellectual disability Father   . Hypertension Maternal Grandmother   . Diabetes Maternal Grandfather   . Arthritis Maternal Grandfather   . Hyperlipidemia Maternal Grandfather   . Arthritis Paternal Grandmother   . Cancer Paternal Grandmother   . Depression Paternal Grandmother   . Heart disease Paternal Grandmother   . Hyperlipidemia Paternal Grandmother   . Miscarriages / Stillbirths Paternal Grandmother   . Stroke Paternal Grandfather     Social History   Tobacco Use  . Smoking status: Never Smoker  . Smokeless tobacco: Never Used  Substance Use Topics  . Alcohol use: Yes    Comment: ocassionallNONE  WHILE  PREG  . Drug use: No      Observations/Objective:  Ht 5\' 5"  (1.651 m)   Wt 180 lb (81.6 kg)   BMI 29.95 kg/m   Wt Readings from Last 3 Encounters:  05/25/19 180 lb (81.6 kg)  04/01/19 194 lb 6.4 oz (88.2 kg)  03/09/16 205 lb 12 oz (93.3 kg)     Assessment and Plan: 1. Other depression -Doing well on current treatment with medication and therapy, encouraged her to continue both as well as nonmedication ways to improve depression -Follow-up in 6 months, sooner if worsening symptoms - buPROPion (WELLBUTRIN XL) 150 MG 24 hr tablet; Take 1 tablet (150 mg total) by mouth daily.  Dispense: 90 tablet; Refill: 0   Clarene Reamer, FNP-BC  [facility]  05/25/2019 3:37 PM   Follow Up Instructions:    I discussed the assessment and treatment plan with the patient. The patient was provided an opportunity to ask questions and all were answered. The patient agreed with the plan and demonstrated an understanding of the instructions.   The patient was advised to call back or seek an in-person evaluation if the symptoms worsen or if the condition fails to improve as anticipated.   Colleen Boyd  Colleen Palmer, FNP

## 2019-10-02 ENCOUNTER — Other Ambulatory Visit: Payer: Self-pay | Admitting: Family Medicine

## 2019-10-02 DIAGNOSIS — F3289 Other specified depressive episodes: Secondary | ICD-10-CM

## 2020-02-15 ENCOUNTER — Other Ambulatory Visit: Payer: Self-pay | Admitting: Family Medicine

## 2020-02-15 DIAGNOSIS — F3289 Other specified depressive episodes: Secondary | ICD-10-CM

## 2020-05-08 ENCOUNTER — Other Ambulatory Visit: Payer: Self-pay | Admitting: Family Medicine

## 2020-05-08 DIAGNOSIS — F3289 Other specified depressive episodes: Secondary | ICD-10-CM

## 2020-05-09 NOTE — Telephone Encounter (Signed)
Last refill 02/15/2020. Last office visit 06/14/2019.  No upcoming appts.  No left on chart, pt will need to schedul appt before any further refill.

## 2020-06-21 ENCOUNTER — Telehealth: Payer: Self-pay | Admitting: Family Medicine

## 2020-06-21 NOTE — Telephone Encounter (Signed)
Called and left brief voicemail for the patient to call back to make a follow up appointment in order to refill prescriptions.

## 2020-06-21 NOTE — Telephone Encounter (Signed)
Refill sent to pharmacy. Patient needs to be seen for further refills. Please follow up with patient and schedule appointment.

## 2020-06-22 NOTE — Telephone Encounter (Signed)
Pt schedule appointment 11/15

## 2020-07-04 ENCOUNTER — Encounter: Payer: Self-pay | Admitting: Family Medicine

## 2020-07-04 ENCOUNTER — Other Ambulatory Visit: Payer: Self-pay

## 2020-07-04 ENCOUNTER — Ambulatory Visit: Payer: BC Managed Care – PPO | Admitting: Family Medicine

## 2020-07-04 VITALS — BP 110/60 | HR 100 | Temp 98.0°F | Ht 65.0 in | Wt 197.0 lb

## 2020-07-04 DIAGNOSIS — E669 Obesity, unspecified: Secondary | ICD-10-CM | POA: Diagnosis not present

## 2020-07-04 DIAGNOSIS — R5383 Other fatigue: Secondary | ICD-10-CM | POA: Diagnosis not present

## 2020-07-04 DIAGNOSIS — F341 Dysthymic disorder: Secondary | ICD-10-CM

## 2020-07-04 DIAGNOSIS — J4520 Mild intermittent asthma, uncomplicated: Secondary | ICD-10-CM

## 2020-07-04 DIAGNOSIS — E6609 Other obesity due to excess calories: Secondary | ICD-10-CM | POA: Diagnosis not present

## 2020-07-04 DIAGNOSIS — Z6832 Body mass index (BMI) 32.0-32.9, adult: Secondary | ICD-10-CM | POA: Diagnosis not present

## 2020-07-04 MED ORDER — ALBUTEROL SULFATE HFA 108 (90 BASE) MCG/ACT IN AERS: 2.0000 | INHALATION_SPRAY | RESPIRATORY_TRACT | 1 refills | Status: AC | PRN

## 2020-07-04 MED ORDER — BUPROPION HCL ER (XL) 150 MG PO TB24: 150.0000 mg | ORAL_TABLET | Freq: Every day | ORAL | 3 refills | Status: AC

## 2020-07-04 NOTE — Patient Instructions (Signed)
There is not one right eating plan for everyone.  It may take trial and error to find what will work for you.  It is important to get adequate protein and fiber with your meals.  It is okay to not eat breakfast or to skip meals if you are not hungry.  Avoid snacking between meals.  Unless you are on a fluid restriction, drink 80 to 90 ounces of water a day.  Suggested resources- www.dietdoctor.com/diabetes/diet www.adaptyourlifeacademy.com-there is a quiz to help you determine how many carbohydrates you should eat a day  www.thefastingmethod.com  If you have diabetes or access to a blood sugar machine, I recommend you check your blood sugar daily and keep a log.  Vary the time you check your blood sugar such as fasting, before meal, 2 hours after a meal and at bedtime.  Look for trends with the foods you are eating and be a scientist of your body.  Here are some guidelines to help you with meal planning -  Avoid all processed and packaged foods (bread, pasta, crackers, chips, etc) and beverages containing calories.  Avoid added sugars and excessive natural sugars.  Pay attention to how you feel if you consume artificial sweeteners.  Do they make you more hungry or raise your blood sugar?  With every meal and snack, aim to get 20 g of protein (3 ounces of meat, 4 ounces of fish, 3 eggs, protein powder, 1 cup Greek yogurt, 1 cup cottage cheese, etc.)  Increase fiber in the form of non-starchy vegetables.  These help you feel full with very little carbohydrates and are good for gut health.  Nonstarchy vegetables include summer squash, onions, peppers, tomatoes, eggplant, broccoli, cauliflower, cabbage, lettuce, spinach.  Have small amounts of good fats such as avocado, nuts, olive oil, nut butters, olives.  Add a little cheese to your meals to make them tasty.   Try to plan your meals for the week and do some meal preparation when able.  If possible, make lunches for the week ahead of time.  Plan a  couple of dinners and make enough so you can have leftovers.  Build in a treat once a week.   

## 2020-07-04 NOTE — Progress Notes (Signed)
Subjective:    Patient ID: Colleen Boyd, female    DOB: 28-Jun-1986, 34 y.o.   MRN: [id number]  HPI Chief Complaint  Patient presents with  . Depression    med refill    Has been doing ok, feels like Wellbutrin is working well. Feels like her energy level is low. Gets work things done but struggles to be productive around her home. Sleeping about 7-8 hours a night. Feels rested some nights. For long time, low energy, at least 10 years. Increased energy while pregnant. Periods a little more irregular, some skipped periods with starting Wellbutrin, now more regular.   Diet- "terrible," right now. Eats lot of fast foods/ sweets, drinks water, coffee.  Drinks 1 bottle of water and 1 cup of coffee a day.  Has done keto and low-carb in the past.  Had some numbness and tingling of her hands with strict keto which resolved with reintroducing carbohydrates into her diet.  Asthma-rare use of albuterol inhaler  Review of Systems Denies headaches, visual changes, chest pain, shortness of breath, abdominal pain, leg swelling    Objective:   Physical Exam Physical Exam  Constitutional: Oriented to person, place, and time. Appears well-developed and well-nourished.  HENT:  Head: Normocephalic and atraumatic.  Eyes: Conjunctivae are normal.  Neck: Normal range of motion. Neck supple.  Cardiovascular: Normal rate, regular rhythm and normal heart sounds.   Pulmonary/Chest: Effort normal and breath sounds normal.  Musculoskeletal: No lower extremity edema.   Neurological: Alert and oriented to person, place, and time.  Skin: Skin is warm and dry.  Psychiatric: Normal mood and affect. Behavior is normal. Judgment and thought content normal.  Vitals reviewed.     BP 110/60   Pulse 100   Temp 98 F (36.7 C) (Temporal)   Ht 5\' 5"  (1.651 m)   Wt 197 lb (89.4 kg)   SpO2 98%   BMI 32.78 kg/m  Wt Readings from Last 3 Encounters:  07/04/20 197 lb (89.4 kg)  05/25/19 180 lb (81.6 kg)  04/01/19  194 lb 6.4 oz (88.2 kg)   GAD 7 : Generalized Anxiety Score 07/04/2020  Nervous, Anxious, on Edge 1  Control/stop worrying 1  Worry too much - different things 1  Trouble relaxing 0  Restless 0  Easily annoyed or irritable 1  Afraid - awful might happen 1  Total GAD 7 Score 5  Anxiety Difficulty Very difficult    Depression screen [facility] 2/9 07/04/2020 04/01/2019  Decreased Interest 0 2  Down, Depressed, Hopeless 1 2  PHQ - 2 Score 1 4  Altered sleeping 0 2  Tired, decreased energy 3 3  Change in appetite 0 1  Feeling bad or failure about yourself  0 2  Trouble concentrating 0 0  Moving slowly or fidgety/restless 0 0  Suicidal thoughts 0 0  PHQ-9 Score 4 12  Difficult doing work/chores Not difficult at all Very difficult       Assessment & Plan:  1. Dysthymia -She feels like she is doing well on Wellbutrin XL 150 mg daily, will continue on this dose, continue with therapy - buPROPion (WELLBUTRIN XL) 150 MG 24 hr tablet; Take 1 tablet (150 mg total) by mouth daily.  Dispense: 90 tablet; Refill: 3  2. Class 1 obesity due to excess calories without serious comorbidity with body mass index (BMI) of 32.0 to 32.9 in adult -Discussed intake and barriers to healthy food choices.  Provided resources regarding healthy diet. - Lipid Panel  3.  Fatigue, unspecified type -Unclear etiology, discussed role of sleep, stress, diet - CBC with Differential - Ferritin  4. Mild intermittent asthma without complication - albuterol (VENTOLIN HFA) 108 (90 Base) MCG/ACT inhaler; Inhale 2 puffs into the lungs every 4 (four) hours as needed for wheezing or shortness of breath (cough, shortness of breath or wheezing.).  Dispense: 18 each; Refill: 1  This visit occurred during the SARS-CoV-2 public health emergency.  Safety protocols were in place, including screening questions prior to the visit, additional usage of staff PPE, and extensive cleaning of exam room while observing appropriate contact time  as indicated for disinfecting solutions.     Olean Ree, FNP-BC  [facility]  07/04/2020 4:40 PM

## 2020-07-05 LAB — LIPID PANEL
Cholesterol: 231 mg/dL — ABNORMAL HIGH (ref 0–200)
HDL: 53.8 mg/dL (ref >39.00)
LDL Cholesterol: 138 mg/dL — ABNORMAL HIGH (ref 0–99)
NonHDL: 177.56
Total CHOL/HDL Ratio: 4
Triglycerides: 199 mg/dL — ABNORMAL HIGH (ref 0.0–149.0)
VLDL: 39.8 mg/dL (ref 0.0–40.0)

## 2020-07-05 LAB — CBC WITH DIFFERENTIAL/PLATELET
Basophils Absolute: 0.1 10*3/uL (ref 0.0–0.1)
Basophils Relative: 0.8 % (ref 0.0–3.0)
Eosinophils Absolute: 0.3 10*3/uL (ref 0.0–0.7)
Eosinophils Relative: 2.2 % (ref 0.0–5.0)
HCT: 39 % (ref 36.0–46.0)
Hemoglobin: 12.8 g/dL (ref 12.0–15.0)
Lymphocytes Relative: 19.4 % (ref 12.0–46.0)
Lymphs Abs: 2.6 10*3/uL (ref 0.7–4.0)
MCHC: 32.9 g/dL (ref 30.0–36.0)
MCV: 81.2 fl (ref 78.0–100.0)
Monocytes Absolute: 0.8 10*3/uL (ref 0.1–1.0)
Monocytes Relative: 6.3 % (ref 3.0–12.0)
Neutro Abs: 9.4 10*3/uL — ABNORMAL HIGH (ref 1.4–7.7)
Neutrophils Relative %: 71.3 % (ref 43.0–77.0)
Platelets: 427 10*3/uL — ABNORMAL HIGH (ref 150.0–400.0)
RBC: 4.8 Mil/uL (ref 3.87–5.11)
RDW: 14.7 % (ref 11.5–15.5)
WBC: 13.2 10*3/uL — ABNORMAL HIGH (ref 4.0–10.5)

## 2020-07-05 LAB — FERRITIN: Ferritin: 22 ng/mL (ref 10.0–291.0)
